# Patient Record
Sex: Female | Born: 1935 | Race: White | Hispanic: No
Health system: Southern US, Community
[De-identification: ages and names within clinical notes are randomized; demographics above are authoritative.]

## PROBLEM LIST (undated history)

## (undated) DIAGNOSIS — E7800 Pure hypercholesterolemia, unspecified: Secondary | ICD-10-CM

## (undated) DIAGNOSIS — T39395A Adverse effect of other nonsteroidal anti-inflammatory drugs [NSAID], initial encounter: Secondary | ICD-10-CM

## (undated) DIAGNOSIS — I4891 Unspecified atrial fibrillation: Secondary | ICD-10-CM

## (undated) DIAGNOSIS — I10 Essential (primary) hypertension: Secondary | ICD-10-CM

## (undated) DIAGNOSIS — E119 Type 2 diabetes mellitus without complications: Secondary | ICD-10-CM

## (undated) DIAGNOSIS — K222 Esophageal obstruction: Secondary | ICD-10-CM

## (undated) DIAGNOSIS — K648 Other hemorrhoids: Secondary | ICD-10-CM

## (undated) DIAGNOSIS — K449 Diaphragmatic hernia without obstruction or gangrene: Secondary | ICD-10-CM

## (undated) DIAGNOSIS — E039 Hypothyroidism, unspecified: Secondary | ICD-10-CM

## (undated) DIAGNOSIS — K269 Duodenal ulcer, unspecified as acute or chronic, without hemorrhage or perforation: Secondary | ICD-10-CM

## (undated) HISTORY — DX: Other hemorrhoids: K64.8

## (undated) HISTORY — DX: Type 2 diabetes mellitus without complications: E11.9

## (undated) HISTORY — DX: Esophageal obstruction: K22.2

## (undated) HISTORY — DX: Adverse effect of other nonsteroidal anti-inflammatory drugs (NSAID), initial encounter: T39.395A

## (undated) HISTORY — DX: Hypothyroidism, unspecified: E03.9

## (undated) HISTORY — DX: Pure hypercholesterolemia, unspecified: E78.00

## (undated) HISTORY — DX: Duodenal ulcer, unspecified as acute or chronic, without hemorrhage or perforation: K26.9

## (undated) HISTORY — DX: Unspecified atrial fibrillation: I48.91

## (undated) HISTORY — DX: Essential (primary) hypertension: I10

## (undated) HISTORY — DX: Diaphragmatic hernia without obstruction or gangrene: K44.9

---

## 1997-06-17 ENCOUNTER — Other Ambulatory Visit: Admission: RE | Admit: 1997-06-17 | Discharge: 1997-06-17 | Payer: Self-pay | Admitting: Internal Medicine

## 1999-08-13 ENCOUNTER — Ambulatory Visit (HOSPITAL_COMMUNITY): Admission: RE | Admit: 1999-08-13 | Discharge: 1999-08-13 | Payer: Self-pay | Admitting: Internal Medicine

## 1999-08-13 ENCOUNTER — Encounter: Payer: Self-pay | Admitting: Internal Medicine

## 1999-08-18 ENCOUNTER — Ambulatory Visit (HOSPITAL_COMMUNITY): Admission: RE | Admit: 1999-08-18 | Discharge: 1999-08-18 | Payer: Self-pay | Admitting: Internal Medicine

## 1999-09-20 ENCOUNTER — Ambulatory Visit (HOSPITAL_COMMUNITY): Admission: RE | Admit: 1999-09-20 | Discharge: 1999-09-20 | Payer: Self-pay | Admitting: Cardiology

## 2000-11-09 ENCOUNTER — Ambulatory Visit (HOSPITAL_COMMUNITY): Admission: RE | Admit: 2000-11-09 | Discharge: 2000-11-09 | Payer: Self-pay | Admitting: *Deleted

## 2000-12-13 ENCOUNTER — Ambulatory Visit (HOSPITAL_COMMUNITY): Admission: RE | Admit: 2000-12-13 | Discharge: 2000-12-13 | Payer: Self-pay | Admitting: *Deleted

## 2001-04-02 ENCOUNTER — Encounter: Payer: Self-pay | Admitting: Cardiology

## 2001-04-02 ENCOUNTER — Ambulatory Visit (HOSPITAL_COMMUNITY): Admission: RE | Admit: 2001-04-02 | Discharge: 2001-04-02 | Payer: Self-pay | Admitting: Cardiology

## 2002-09-22 ENCOUNTER — Inpatient Hospital Stay (HOSPITAL_COMMUNITY): Admission: EM | Admit: 2002-09-22 | Discharge: 2002-09-25 | Payer: Self-pay | Admitting: Emergency Medicine

## 2002-09-22 ENCOUNTER — Encounter: Payer: Self-pay | Admitting: Emergency Medicine

## 2002-09-26 ENCOUNTER — Encounter: Payer: Self-pay | Admitting: Emergency Medicine

## 2002-09-26 ENCOUNTER — Observation Stay (HOSPITAL_COMMUNITY): Admission: EM | Admit: 2002-09-26 | Discharge: 2002-09-27 | Payer: Self-pay | Admitting: Emergency Medicine

## 2002-10-01 ENCOUNTER — Observation Stay (HOSPITAL_COMMUNITY): Admission: EM | Admit: 2002-10-01 | Discharge: 2002-10-02 | Payer: Self-pay | Admitting: Emergency Medicine

## 2002-10-01 ENCOUNTER — Encounter: Payer: Self-pay | Admitting: Emergency Medicine

## 2002-10-21 ENCOUNTER — Encounter (HOSPITAL_COMMUNITY): Admission: RE | Admit: 2002-10-21 | Discharge: 2003-01-19 | Payer: Self-pay | Admitting: Emergency Medicine

## 2002-12-25 ENCOUNTER — Inpatient Hospital Stay (HOSPITAL_COMMUNITY): Admission: EM | Admit: 2002-12-25 | Discharge: 2002-12-27 | Payer: Self-pay | Admitting: Emergency Medicine

## 2002-12-25 ENCOUNTER — Encounter: Payer: Self-pay | Admitting: Surgery

## 2002-12-26 ENCOUNTER — Encounter (INDEPENDENT_AMBULATORY_CARE_PROVIDER_SITE_OTHER): Payer: Self-pay | Admitting: Specialist

## 2003-01-07 ENCOUNTER — Inpatient Hospital Stay (HOSPITAL_COMMUNITY): Admission: EM | Admit: 2003-01-07 | Discharge: 2003-01-12 | Payer: Self-pay | Admitting: Emergency Medicine

## 2003-03-02 ENCOUNTER — Encounter: Admission: RE | Admit: 2003-03-02 | Discharge: 2003-03-02 | Payer: Self-pay | Admitting: Internal Medicine

## 2003-05-24 ENCOUNTER — Emergency Department (HOSPITAL_COMMUNITY): Admission: EM | Admit: 2003-05-24 | Discharge: 2003-05-24 | Payer: Self-pay

## 2003-10-17 ENCOUNTER — Inpatient Hospital Stay (HOSPITAL_COMMUNITY): Admission: EM | Admit: 2003-10-17 | Discharge: 2003-10-21 | Payer: Self-pay | Admitting: Emergency Medicine

## 2004-02-11 ENCOUNTER — Ambulatory Visit (HOSPITAL_COMMUNITY): Admission: RE | Admit: 2004-02-11 | Discharge: 2004-02-11 | Payer: Self-pay | Admitting: Cardiology

## 2004-06-22 ENCOUNTER — Encounter (INDEPENDENT_AMBULATORY_CARE_PROVIDER_SITE_OTHER): Payer: Self-pay | Admitting: Radiology

## 2004-06-22 ENCOUNTER — Encounter (INDEPENDENT_AMBULATORY_CARE_PROVIDER_SITE_OTHER): Payer: Self-pay | Admitting: Specialist

## 2004-06-22 ENCOUNTER — Encounter: Admission: RE | Admit: 2004-06-22 | Discharge: 2004-06-22 | Payer: Self-pay | Admitting: Surgery

## 2004-07-01 ENCOUNTER — Encounter: Admission: RE | Admit: 2004-07-01 | Discharge: 2004-07-01 | Payer: Self-pay | Admitting: Surgery

## 2004-07-05 ENCOUNTER — Encounter: Admission: RE | Admit: 2004-07-05 | Discharge: 2004-07-05 | Payer: Self-pay | Admitting: Surgery

## 2004-07-07 ENCOUNTER — Ambulatory Visit (HOSPITAL_COMMUNITY): Admission: RE | Admit: 2004-07-07 | Discharge: 2004-07-07 | Payer: Self-pay | Admitting: Surgery

## 2004-07-07 ENCOUNTER — Ambulatory Visit (HOSPITAL_BASED_OUTPATIENT_CLINIC_OR_DEPARTMENT_OTHER): Admission: RE | Admit: 2004-07-07 | Discharge: 2004-07-07 | Payer: Self-pay | Admitting: Surgery

## 2004-07-07 ENCOUNTER — Encounter (INDEPENDENT_AMBULATORY_CARE_PROVIDER_SITE_OTHER): Payer: Self-pay | Admitting: Specialist

## 2004-07-07 ENCOUNTER — Encounter (INDEPENDENT_AMBULATORY_CARE_PROVIDER_SITE_OTHER): Payer: Self-pay | Admitting: Surgery

## 2004-07-08 ENCOUNTER — Ambulatory Visit: Payer: Self-pay | Admitting: Oncology

## 2004-07-13 ENCOUNTER — Ambulatory Visit: Admission: RE | Admit: 2004-07-13 | Discharge: 2004-09-20 | Payer: Self-pay | Admitting: Radiation Oncology

## 2004-09-09 ENCOUNTER — Ambulatory Visit: Payer: Self-pay | Admitting: Oncology

## 2004-09-15 ENCOUNTER — Ambulatory Visit (HOSPITAL_BASED_OUTPATIENT_CLINIC_OR_DEPARTMENT_OTHER): Admission: RE | Admit: 2004-09-15 | Discharge: 2004-09-15 | Payer: Self-pay | Admitting: General Surgery

## 2004-09-15 ENCOUNTER — Ambulatory Visit: Admission: RE | Admit: 2004-09-15 | Discharge: 2004-09-15 | Payer: Self-pay | Admitting: General Surgery

## 2004-10-26 ENCOUNTER — Ambulatory Visit: Payer: Self-pay | Admitting: Oncology

## 2004-12-14 ENCOUNTER — Ambulatory Visit: Admission: RE | Admit: 2004-12-14 | Discharge: 2005-03-10 | Payer: Self-pay | Admitting: Radiation Oncology

## 2004-12-16 ENCOUNTER — Ambulatory Visit: Payer: Self-pay | Admitting: Oncology

## 2004-12-23 ENCOUNTER — Encounter: Admission: RE | Admit: 2004-12-23 | Discharge: 2004-12-23 | Payer: Self-pay | Admitting: Radiation Oncology

## 2005-02-25 ENCOUNTER — Ambulatory Visit: Payer: Self-pay | Admitting: Oncology

## 2005-04-19 ENCOUNTER — Ambulatory Visit: Payer: Self-pay | Admitting: Oncology

## 2005-05-23 ENCOUNTER — Encounter: Admission: RE | Admit: 2005-05-23 | Discharge: 2005-05-23 | Payer: Self-pay | Admitting: Surgery

## 2005-06-23 ENCOUNTER — Ambulatory Visit: Payer: Self-pay | Admitting: Oncology

## 2005-06-24 ENCOUNTER — Ambulatory Visit (HOSPITAL_COMMUNITY): Admission: RE | Admit: 2005-06-24 | Discharge: 2005-06-24 | Payer: Self-pay | Admitting: Oncology

## 2005-06-24 LAB — LACTATE DEHYDROGENASE: LDH: 198 U/L (ref 94–250)

## 2005-06-24 LAB — CBC WITH DIFFERENTIAL/PLATELET
Basophils Absolute: 0.1 10*3/uL (ref 0.0–0.1)
Eosinophils Absolute: 0.1 10*3/uL (ref 0.0–0.5)
HCT: 39.6 % (ref 34.8–46.6)
HGB: 13.8 g/dL (ref 11.6–15.9)
MONO#: 0.6 10*3/uL (ref 0.1–0.9)
NEUT#: 2.9 10*3/uL (ref 1.5–6.5)
RDW: 14.4 % (ref 11.3–14.5)
lymph#: 1.5 10*3/uL (ref 0.9–3.3)

## 2005-06-24 LAB — COMPREHENSIVE METABOLIC PANEL
AST: 21 U/L (ref 0–37)
Albumin: 3.9 g/dL (ref 3.5–5.2)
BUN: 20 mg/dL (ref 6–23)
CO2: 24 mEq/L (ref 19–32)
Calcium: 9 mg/dL (ref 8.4–10.5)
Chloride: 105 mEq/L (ref 96–112)
Glucose, Bld: 148 mg/dL — ABNORMAL HIGH (ref 70–99)
Potassium: 4.2 mEq/L (ref 3.5–5.3)

## 2005-08-17 ENCOUNTER — Ambulatory Visit: Payer: Self-pay | Admitting: Oncology

## 2005-08-19 LAB — CBC WITH DIFFERENTIAL/PLATELET
Basophils Absolute: 0 10*3/uL (ref 0.0–0.1)
Eosinophils Absolute: 0.1 10*3/uL (ref 0.0–0.5)
HGB: 14.4 g/dL (ref 11.6–15.9)
MONO#: 0.6 10*3/uL (ref 0.1–0.9)
NEUT#: 3.4 10*3/uL (ref 1.5–6.5)
RBC: 4.68 10*6/uL (ref 3.70–5.32)
RDW: 13.4 % (ref 11.3–14.5)
WBC: 5.7 10*3/uL (ref 3.9–10.0)

## 2005-08-19 LAB — COMPREHENSIVE METABOLIC PANEL
BUN: 21 mg/dL (ref 6–23)
CO2: 24 mEq/L (ref 19–32)
Calcium: 8.9 mg/dL (ref 8.4–10.5)
Chloride: 105 mEq/L (ref 96–112)
Creatinine, Ser: 1.01 mg/dL (ref 0.40–1.20)
Total Bilirubin: 1.1 mg/dL (ref 0.3–1.2)

## 2005-08-19 LAB — LACTATE DEHYDROGENASE: LDH: 182 U/L (ref 94–250)

## 2005-10-12 ENCOUNTER — Ambulatory Visit: Payer: Self-pay | Admitting: Oncology

## 2005-10-14 LAB — COMPREHENSIVE METABOLIC PANEL
Albumin: 3.9 g/dL (ref 3.5–5.2)
Alkaline Phosphatase: 64 U/L (ref 39–117)
BUN: 19 mg/dL (ref 6–23)
Calcium: 9.2 mg/dL (ref 8.4–10.5)
Chloride: 104 mEq/L (ref 96–112)
Creatinine, Ser: 0.92 mg/dL (ref 0.40–1.20)
Glucose, Bld: 184 mg/dL — ABNORMAL HIGH (ref 70–99)
Potassium: 4.2 mEq/L (ref 3.5–5.3)

## 2005-10-14 LAB — CBC WITH DIFFERENTIAL/PLATELET
Basophils Absolute: 0 10*3/uL (ref 0.0–0.1)
EOS%: 2.7 % (ref 0.0–7.0)
Eosinophils Absolute: 0.2 10*3/uL (ref 0.0–0.5)
HCT: 41.1 % (ref 34.8–46.6)
HGB: 14.2 g/dL (ref 11.6–15.9)
MCH: 31 pg (ref 26.0–34.0)
MCV: 89.8 fL (ref 81.0–101.0)
MONO%: 12.2 % (ref 0.0–13.0)
NEUT#: 3.3 10*3/uL (ref 1.5–6.5)
NEUT%: 57.1 % (ref 39.6–76.8)
RDW: 13.4 % (ref 11.3–14.5)

## 2005-10-24 ENCOUNTER — Ambulatory Visit (HOSPITAL_COMMUNITY): Admission: RE | Admit: 2005-10-24 | Discharge: 2005-10-24 | Payer: Self-pay | Admitting: Oncology

## 2005-11-10 ENCOUNTER — Emergency Department (HOSPITAL_COMMUNITY): Admission: EM | Admit: 2005-11-10 | Discharge: 2005-11-10 | Payer: Self-pay | Admitting: Emergency Medicine

## 2005-11-24 ENCOUNTER — Encounter: Admission: RE | Admit: 2005-11-24 | Discharge: 2005-11-24 | Payer: Self-pay | Admitting: Orthopaedic Surgery

## 2005-12-15 ENCOUNTER — Encounter: Admission: RE | Admit: 2005-12-15 | Discharge: 2005-12-23 | Payer: Self-pay | Admitting: Orthopaedic Surgery

## 2005-12-20 ENCOUNTER — Ambulatory Visit: Payer: Self-pay | Admitting: Oncology

## 2005-12-22 LAB — CBC WITH DIFFERENTIAL/PLATELET
BASO%: 0.5 % (ref 0.0–2.0)
Basophils Absolute: 0 10*3/uL (ref 0.0–0.1)
EOS%: 1.6 % (ref 0.0–7.0)
Eosinophils Absolute: 0.1 10*3/uL (ref 0.0–0.5)
HCT: 42.2 % (ref 34.8–46.6)
HGB: 14.6 g/dL (ref 11.6–15.9)
MONO#: 0.7 10*3/uL (ref 0.1–0.9)
MONO%: 10.8 % (ref 0.0–13.0)
NEUT#: 3.7 10*3/uL (ref 1.5–6.5)
NEUT%: 58 % (ref 39.6–76.8)
Platelets: 259 10*3/uL (ref 145–400)
WBC: 6.4 10*3/uL (ref 3.9–10.0)

## 2005-12-22 LAB — COMPREHENSIVE METABOLIC PANEL
ALT: 18 U/L (ref 0–40)
AST: 23 U/L (ref 0–37)
Alkaline Phosphatase: 71 U/L (ref 39–117)
BUN: 20 mg/dL (ref 6–23)
Calcium: 9.2 mg/dL (ref 8.4–10.5)
Creatinine, Ser: 0.89 mg/dL (ref 0.40–1.20)
Total Bilirubin: 1.3 mg/dL — ABNORMAL HIGH (ref 0.3–1.2)

## 2006-01-03 ENCOUNTER — Encounter: Admission: RE | Admit: 2006-01-03 | Discharge: 2006-01-03 | Payer: Self-pay | Admitting: Orthopaedic Surgery

## 2006-02-13 ENCOUNTER — Ambulatory Visit: Payer: Self-pay | Admitting: Oncology

## 2006-02-16 LAB — COMPREHENSIVE METABOLIC PANEL
AST: 17 U/L (ref 0–37)
Alkaline Phosphatase: 66 U/L (ref 39–117)
BUN: 19 mg/dL (ref 6–23)
Glucose, Bld: 156 mg/dL — ABNORMAL HIGH (ref 70–99)
Sodium: 141 mEq/L (ref 135–145)
Total Bilirubin: 1.3 mg/dL — ABNORMAL HIGH (ref 0.3–1.2)

## 2006-02-16 LAB — CBC WITH DIFFERENTIAL/PLATELET
Basophils Absolute: 0 10*3/uL (ref 0.0–0.1)
EOS%: 2 % (ref 0.0–7.0)
Eosinophils Absolute: 0.1 10*3/uL (ref 0.0–0.5)
LYMPH%: 26.4 % (ref 14.0–48.0)
MCH: 31.4 pg (ref 26.0–34.0)
MCV: 90.9 fL (ref 81.0–101.0)
MONO%: 12.3 % (ref 0.0–13.0)
NEUT#: 4.2 10*3/uL (ref 1.5–6.5)
Platelets: 237 10*3/uL (ref 145–400)
RBC: 4.49 10*6/uL (ref 3.70–5.32)

## 2006-04-17 ENCOUNTER — Ambulatory Visit: Payer: Self-pay | Admitting: Oncology

## 2006-04-20 LAB — CBC WITH DIFFERENTIAL/PLATELET
Basophils Absolute: 0 10*3/uL (ref 0.0–0.1)
Eosinophils Absolute: 0.2 10*3/uL (ref 0.0–0.5)
HCT: 41.8 % (ref 34.8–46.6)
HGB: 14.8 g/dL (ref 11.6–15.9)
LYMPH%: 31.8 % (ref 14.0–48.0)
MCV: 89.4 fL (ref 81.0–101.0)
MONO#: 0.8 10*3/uL (ref 0.1–0.9)
MONO%: 10.7 % (ref 0.0–13.0)
NEUT#: 4 10*3/uL (ref 1.5–6.5)
NEUT%: 55.1 % (ref 39.6–76.8)
Platelets: 246 10*3/uL (ref 145–400)
RBC: 4.67 10*6/uL (ref 3.70–5.32)

## 2006-04-20 LAB — COMPREHENSIVE METABOLIC PANEL
Alkaline Phosphatase: 61 U/L (ref 39–117)
BUN: 19 mg/dL (ref 6–23)
CO2: 24 mEq/L (ref 19–32)
Glucose, Bld: 169 mg/dL — ABNORMAL HIGH (ref 70–99)
Total Bilirubin: 1.3 mg/dL — ABNORMAL HIGH (ref 0.3–1.2)

## 2006-04-20 LAB — LACTATE DEHYDROGENASE: LDH: 191 U/L (ref 94–250)

## 2006-05-10 ENCOUNTER — Encounter: Admission: RE | Admit: 2006-05-10 | Discharge: 2006-05-10 | Payer: Self-pay | Admitting: Orthopaedic Surgery

## 2006-05-22 ENCOUNTER — Encounter: Admission: RE | Admit: 2006-05-22 | Discharge: 2006-05-22 | Payer: Self-pay | Admitting: Orthopaedic Surgery

## 2006-05-25 ENCOUNTER — Encounter: Admission: RE | Admit: 2006-05-25 | Discharge: 2006-05-25 | Payer: Self-pay | Admitting: Oncology

## 2006-06-13 ENCOUNTER — Ambulatory Visit: Payer: Self-pay | Admitting: Oncology

## 2006-06-15 ENCOUNTER — Ambulatory Visit (HOSPITAL_COMMUNITY): Admission: RE | Admit: 2006-06-15 | Discharge: 2006-06-15 | Payer: Self-pay | Admitting: Oncology

## 2006-06-15 LAB — CBC WITH DIFFERENTIAL/PLATELET
Basophils Absolute: 0 10*3/uL (ref 0.0–0.1)
EOS%: 1.5 % (ref 0.0–7.0)
HCT: 40 % (ref 34.8–46.6)
MONO%: 10.2 % (ref 0.0–13.0)
NEUT#: 3.4 10*3/uL (ref 1.5–6.5)
Platelets: 193 10*3/uL (ref 145–400)
RBC: 4.5 10*6/uL (ref 3.70–5.32)
WBC: 5.7 10*3/uL (ref 3.9–10.0)

## 2006-06-15 LAB — COMPREHENSIVE METABOLIC PANEL
AST: 17 U/L (ref 0–37)
Albumin: 3.7 g/dL (ref 3.5–5.2)
Alkaline Phosphatase: 56 U/L (ref 39–117)
BUN: 20 mg/dL (ref 6–23)
Creatinine, Ser: 0.82 mg/dL (ref 0.40–1.20)
Potassium: 4.1 mEq/L (ref 3.5–5.3)

## 2006-06-29 ENCOUNTER — Ambulatory Visit (HOSPITAL_BASED_OUTPATIENT_CLINIC_OR_DEPARTMENT_OTHER): Admission: RE | Admit: 2006-06-29 | Discharge: 2006-06-29 | Payer: Self-pay | Admitting: Surgery

## 2006-08-14 ENCOUNTER — Inpatient Hospital Stay (HOSPITAL_COMMUNITY): Admission: EM | Admit: 2006-08-14 | Discharge: 2006-08-23 | Payer: Self-pay | Admitting: Emergency Medicine

## 2006-08-14 ENCOUNTER — Ambulatory Visit: Payer: Self-pay | Admitting: Vascular Surgery

## 2006-08-15 ENCOUNTER — Encounter (INDEPENDENT_AMBULATORY_CARE_PROVIDER_SITE_OTHER): Payer: Self-pay | Admitting: Interventional Cardiology

## 2006-08-16 ENCOUNTER — Encounter (INDEPENDENT_AMBULATORY_CARE_PROVIDER_SITE_OTHER): Payer: Self-pay | Admitting: *Deleted

## 2006-08-17 ENCOUNTER — Encounter (INDEPENDENT_AMBULATORY_CARE_PROVIDER_SITE_OTHER): Payer: Self-pay | Admitting: Cardiology

## 2006-08-18 ENCOUNTER — Encounter (INDEPENDENT_AMBULATORY_CARE_PROVIDER_SITE_OTHER): Payer: Self-pay | Admitting: Cardiovascular Disease

## 2006-09-22 ENCOUNTER — Inpatient Hospital Stay (HOSPITAL_COMMUNITY): Admission: EM | Admit: 2006-09-22 | Discharge: 2006-09-29 | Payer: Self-pay | Admitting: Emergency Medicine

## 2006-10-10 ENCOUNTER — Ambulatory Visit: Payer: Self-pay | Admitting: Oncology

## 2007-02-01 ENCOUNTER — Ambulatory Visit: Payer: Self-pay | Admitting: Oncology

## 2007-02-05 LAB — COMPREHENSIVE METABOLIC PANEL
ALT: 15 U/L (ref 0–35)
AST: 19 U/L (ref 0–37)
Alkaline Phosphatase: 54 U/L (ref 39–117)
Creatinine, Ser: 0.94 mg/dL (ref 0.40–1.20)
Total Bilirubin: 1.3 mg/dL — ABNORMAL HIGH (ref 0.3–1.2)

## 2007-02-05 LAB — CBC WITH DIFFERENTIAL/PLATELET
BASO%: 0.4 % (ref 0.0–2.0)
EOS%: 0.9 % (ref 0.0–7.0)
HCT: 38.4 % (ref 34.8–46.6)
LYMPH%: 25.9 % (ref 14.0–48.0)
MCH: 30.3 pg (ref 26.0–34.0)
MCHC: 34.5 g/dL (ref 32.0–36.0)
NEUT%: 62.6 % (ref 39.6–76.8)
Platelets: 241 10*3/uL (ref 145–400)
lymph#: 1.9 10*3/uL (ref 0.9–3.3)

## 2007-02-21 ENCOUNTER — Emergency Department (HOSPITAL_COMMUNITY): Admission: EM | Admit: 2007-02-21 | Discharge: 2007-02-21 | Payer: Self-pay | Admitting: Emergency Medicine

## 2007-03-01 ENCOUNTER — Encounter (INDEPENDENT_AMBULATORY_CARE_PROVIDER_SITE_OTHER): Payer: Self-pay | Admitting: *Deleted

## 2007-03-01 ENCOUNTER — Ambulatory Visit (HOSPITAL_COMMUNITY): Admission: RE | Admit: 2007-03-01 | Discharge: 2007-03-01 | Payer: Self-pay | Admitting: *Deleted

## 2007-06-01 ENCOUNTER — Ambulatory Visit: Payer: Self-pay | Admitting: Oncology

## 2007-06-18 ENCOUNTER — Encounter: Admission: RE | Admit: 2007-06-18 | Discharge: 2007-06-18 | Payer: Self-pay | Admitting: Surgery

## 2007-10-02 ENCOUNTER — Ambulatory Visit: Payer: Self-pay | Admitting: Oncology

## 2007-10-05 LAB — CBC WITH DIFFERENTIAL/PLATELET
BASO%: 0.5 % (ref 0.0–2.0)
Basophils Absolute: 0 10*3/uL (ref 0.0–0.1)
EOS%: 1.2 % (ref 0.0–7.0)
HGB: 14.3 g/dL (ref 11.6–15.9)
MCH: 30 pg (ref 26.0–34.0)
MCHC: 34.3 g/dL (ref 32.0–36.0)
MCV: 87.4 fL (ref 81.0–101.0)
MONO%: 11 % (ref 0.0–13.0)
RBC: 4.76 10*6/uL (ref 3.70–5.32)
RDW: 14.2 % (ref 11.3–14.5)
lymph#: 2.1 10*3/uL (ref 0.9–3.3)

## 2007-10-05 LAB — COMPREHENSIVE METABOLIC PANEL
ALT: 20 U/L (ref 0–35)
AST: 20 U/L (ref 0–37)
Albumin: 3.8 g/dL (ref 3.5–5.2)
Alkaline Phosphatase: 67 U/L (ref 39–117)
BUN: 22 mg/dL (ref 6–23)
Calcium: 9.3 mg/dL (ref 8.4–10.5)
Chloride: 105 mEq/L (ref 96–112)
Potassium: 4.3 mEq/L (ref 3.5–5.3)
Sodium: 140 mEq/L (ref 135–145)

## 2007-10-14 ENCOUNTER — Inpatient Hospital Stay (HOSPITAL_COMMUNITY): Admission: EM | Admit: 2007-10-14 | Discharge: 2007-10-17 | Payer: Self-pay | Admitting: Emergency Medicine

## 2008-02-01 ENCOUNTER — Ambulatory Visit: Payer: Self-pay | Admitting: Oncology

## 2008-02-05 LAB — LACTATE DEHYDROGENASE: LDH: 234 U/L (ref 94–250)

## 2008-02-05 LAB — CBC WITH DIFFERENTIAL/PLATELET
BASO%: 0.2 % (ref 0.0–2.0)
EOS%: 1.9 % (ref 0.0–7.0)
HCT: 45.3 % (ref 34.8–46.6)
HGB: 15.5 g/dL (ref 11.6–15.9)
MCHC: 34.1 g/dL (ref 32.0–36.0)
MONO#: 0.9 10*3/uL (ref 0.1–0.9)
NEUT%: 61.1 % (ref 39.6–76.8)
RDW: 13.9 % (ref 11.3–14.5)
WBC: 9.3 10*3/uL (ref 3.9–10.0)
lymph#: 2.5 10*3/uL (ref 0.9–3.3)

## 2008-02-05 LAB — COMPREHENSIVE METABOLIC PANEL
ALT: 19 U/L (ref 0–35)
AST: 24 U/L (ref 0–37)
Albumin: 4 g/dL (ref 3.5–5.2)
CO2: 22 mEq/L (ref 19–32)
Calcium: 9.5 mg/dL (ref 8.4–10.5)
Chloride: 102 mEq/L (ref 96–112)
Creatinine, Ser: 1.03 mg/dL (ref 0.40–1.20)
Potassium: 3.9 mEq/L (ref 3.5–5.3)
Total Protein: 6.6 g/dL (ref 6.0–8.3)

## 2008-02-19 ENCOUNTER — Emergency Department (HOSPITAL_COMMUNITY): Admission: EM | Admit: 2008-02-19 | Discharge: 2008-02-19 | Payer: Self-pay | Admitting: Emergency Medicine

## 2008-06-03 ENCOUNTER — Ambulatory Visit: Payer: Self-pay | Admitting: Oncology

## 2008-06-05 LAB — CBC WITH DIFFERENTIAL/PLATELET
Basophils Absolute: 0 10*3/uL (ref 0.0–0.1)
Eosinophils Absolute: 0.1 10*3/uL (ref 0.0–0.5)
HCT: 45.7 % (ref 34.8–46.6)
HGB: 15.6 g/dL (ref 11.6–15.9)
LYMPH%: 34.6 % (ref 14.0–49.7)
MONO#: 0.7 10*3/uL (ref 0.1–0.9)
NEUT#: 4.2 10*3/uL (ref 1.5–6.5)
NEUT%: 55.1 % (ref 38.4–76.8)
Platelets: 216 10*3/uL (ref 145–400)
WBC: 7.7 10*3/uL (ref 3.9–10.3)

## 2008-06-05 LAB — COMPREHENSIVE METABOLIC PANEL
CO2: 22 mEq/L (ref 19–32)
Calcium: 8.9 mg/dL (ref 8.4–10.5)
Chloride: 109 mEq/L (ref 96–112)
Creatinine, Ser: 1.28 mg/dL — ABNORMAL HIGH (ref 0.40–1.20)
Glucose, Bld: 164 mg/dL — ABNORMAL HIGH (ref 70–99)
Total Bilirubin: 1.3 mg/dL — ABNORMAL HIGH (ref 0.3–1.2)

## 2008-06-05 LAB — LACTATE DEHYDROGENASE: LDH: 228 U/L (ref 94–250)

## 2008-06-18 ENCOUNTER — Encounter: Admission: RE | Admit: 2008-06-18 | Discharge: 2008-06-18 | Payer: Self-pay | Admitting: Oncology

## 2008-10-02 ENCOUNTER — Ambulatory Visit: Payer: Self-pay | Admitting: Oncology

## 2008-11-23 IMAGING — CR DG CHEST 1V PORT
1 series · 1 of 1 positions shown · non-contrast
Comparison: 08/16/2006

CLINICAL DATA: Chest pain and shortness of breath. Coronary artery disease. Two recent coronary stents. 
 PORTABLE CHEST:

[view not recorded]
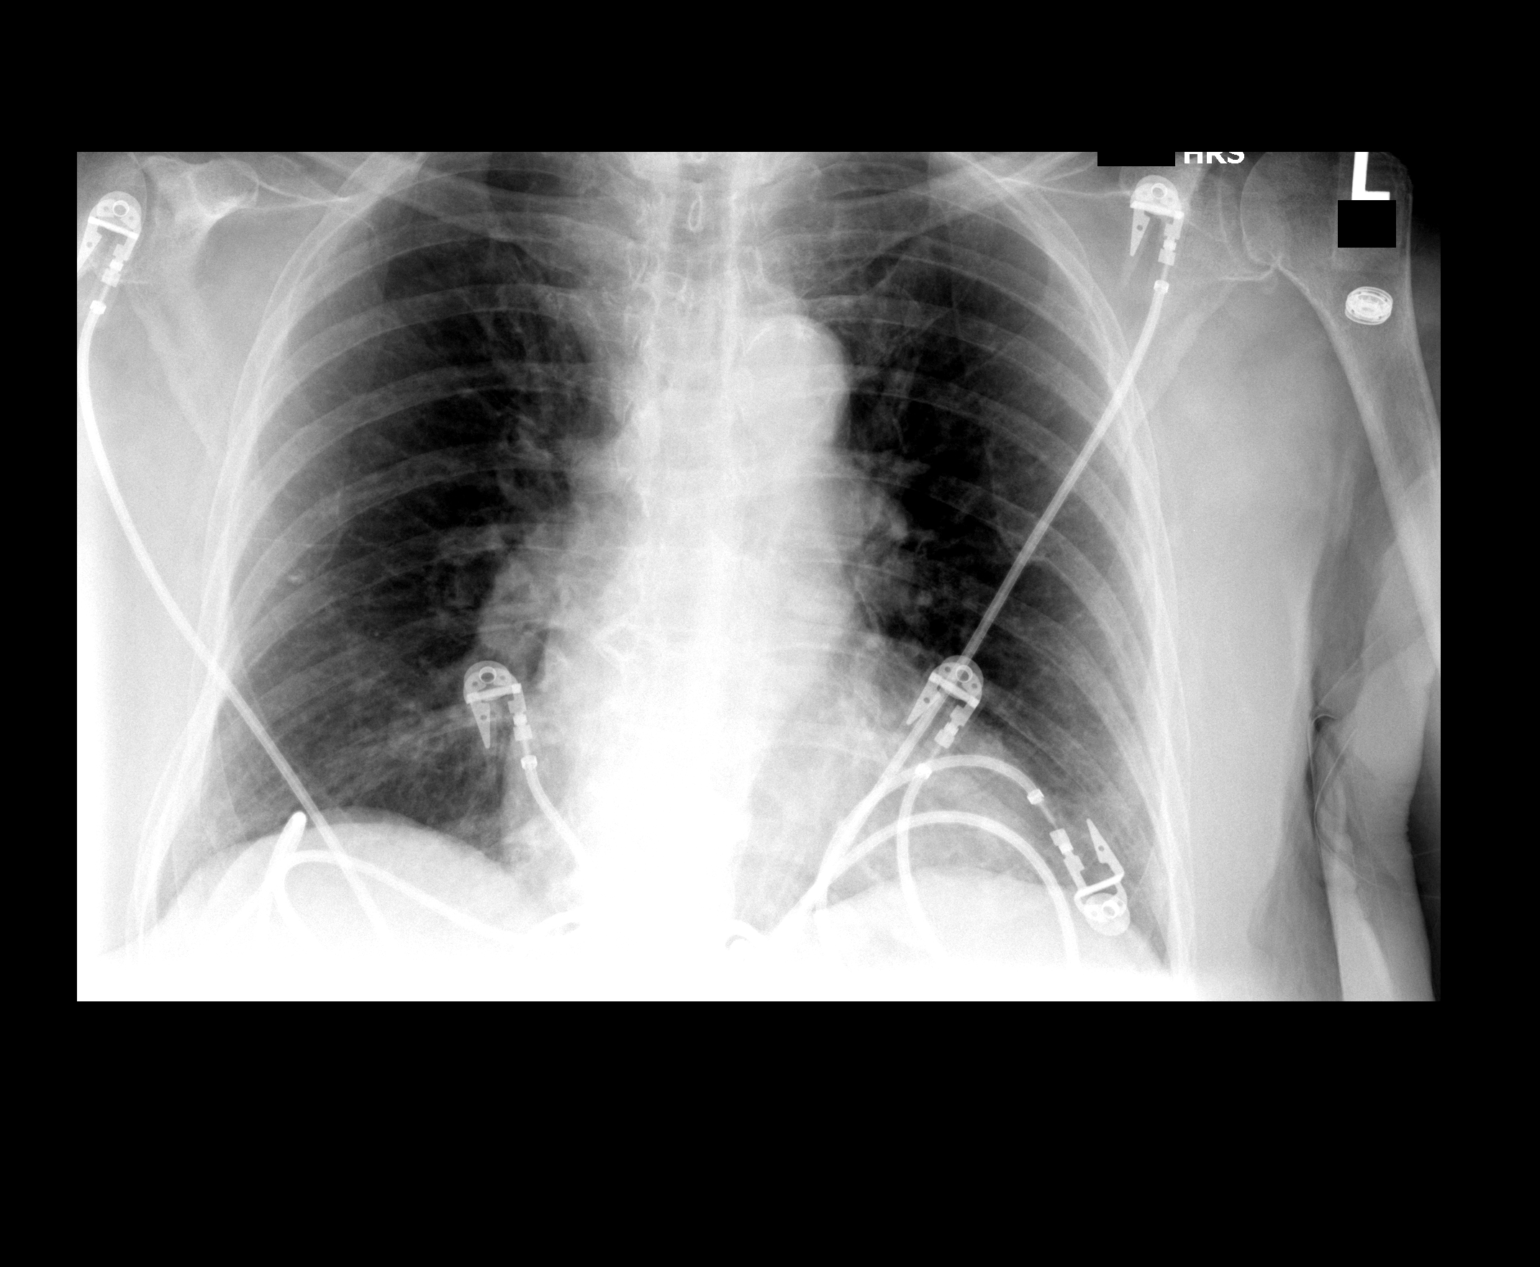

[1 of 1 positions shown; findings below may reference images not displayed]

FINDINGS: Heart size and vascularity are normal. There is some tortuosity and calcification of the thoracic aorta. Lungs are clear. No effusions. No acute bony abnormality.
IMPRESSION: No acute abnormality.

## 2009-07-29 ENCOUNTER — Encounter: Admission: RE | Admit: 2009-07-29 | Discharge: 2009-07-29 | Payer: Self-pay | Admitting: Oncology

## 2010-03-30 ENCOUNTER — Inpatient Hospital Stay (HOSPITAL_COMMUNITY)
Admission: EM | Admit: 2010-03-30 | Discharge: 2010-04-02 | Payer: Self-pay | Source: Home / Self Care | Attending: Internal Medicine | Admitting: Internal Medicine

## 2010-03-31 LAB — URINE MICROSCOPIC-ADD ON

## 2010-03-31 LAB — COMPREHENSIVE METABOLIC PANEL
ALT: 19 U/L (ref 0–35)
AST: 22 U/L (ref 0–37)
Albumin: 3 g/dL — ABNORMAL LOW (ref 3.5–5.2)
Alkaline Phosphatase: 72 U/L (ref 39–117)
BUN: 26 mg/dL — ABNORMAL HIGH (ref 6–23)
CO2: 23 mEq/L (ref 19–32)
Calcium: 9.1 mg/dL (ref 8.4–10.5)
Chloride: 104 mEq/L (ref 96–112)
Creatinine, Ser: 1.14 mg/dL (ref 0.4–1.2)
GFR calc Af Amer: 55 mL/min — ABNORMAL LOW (ref >60)
GFR calc non Af Amer: 45 mL/min — ABNORMAL LOW (ref >60)
Glucose, Bld: 169 mg/dL — ABNORMAL HIGH (ref 70–99)
Potassium: 4 mEq/L (ref 3.5–5.1)
Sodium: 138 mEq/L (ref 135–145)
Total Bilirubin: 1.7 mg/dL — ABNORMAL HIGH (ref 0.3–1.2)
Total Protein: 6.4 g/dL (ref 6.0–8.3)

## 2010-03-31 LAB — CBC
HCT: 44.4 % (ref 36.0–46.0)
HCT: 40 % (ref 36.0–46.0)
Hemoglobin: 15 g/dL (ref 12.0–15.0)
Hemoglobin: 14.1 g/dL (ref 12.0–15.0)
MCH: 30.4 pg (ref 26.0–34.0)
MCH: 30.9 pg (ref 26.0–34.0)
MCHC: 33.8 g/dL (ref 30.0–36.0)
MCHC: 35.3 g/dL (ref 30.0–36.0)
MCV: 89.9 fL (ref 78.0–100.0)
MCV: 87.5 fL (ref 78.0–100.0)
Platelets: 237 10*3/uL (ref 150–400)
Platelets: 246 10*3/uL (ref 150–400)
RBC: 4.94 MIL/uL (ref 3.87–5.11)
RBC: 4.57 MIL/uL (ref 3.87–5.11)
RDW: 13.5 % (ref 11.5–15.5)
RDW: 13.3 % (ref 11.5–15.5)
WBC: 12 10*3/uL — ABNORMAL HIGH (ref 4.0–10.5)
WBC: 11.5 10*3/uL — ABNORMAL HIGH (ref 4.0–10.5)

## 2010-03-31 LAB — CK TOTAL AND CKMB (NOT AT ARMC)
CK, MB: 2.1 ng/mL (ref 0.3–4.0)
Relative Index: INVALID (ref 0.0–2.5)
Total CK: 94 U/L (ref 7–177)

## 2010-03-31 LAB — DIFFERENTIAL
Basophils Absolute: 0.1 10*3/uL (ref 0.0–0.1)
Basophils Relative: 1 % (ref 0–1)
Eosinophils Absolute: 0.1 10*3/uL (ref 0.0–0.7)
Eosinophils Relative: 1 % (ref 0–5)
Lymphocytes Relative: 17 % (ref 12–46)
Lymphs Abs: 2.1 10*3/uL (ref 0.7–4.0)
Monocytes Absolute: 1.5 10*3/uL — ABNORMAL HIGH (ref 0.1–1.0)
Monocytes Relative: 13 % — ABNORMAL HIGH (ref 3–12)
Neutro Abs: 8.2 10*3/uL — ABNORMAL HIGH (ref 1.7–7.7)
Neutrophils Relative %: 68 % (ref 43–77)

## 2010-03-31 LAB — MRSA PCR SCREENING: MRSA by PCR: NEGATIVE

## 2010-03-31 LAB — OCCULT BLOOD, POC DEVICE: Fecal Occult Bld: NEGATIVE

## 2010-03-31 LAB — URINALYSIS, ROUTINE W REFLEX MICROSCOPIC
Ketones, ur: 15 mg/dL — AB
Leukocytes, UA: NEGATIVE
Nitrite: POSITIVE — AB
Protein, ur: 100 mg/dL — AB
Specific Gravity, Urine: >1.03 — ABNORMAL HIGH (ref 1.005–1.030)
Urine Glucose, Fasting: 100 mg/dL — AB
Urobilinogen, UA: 1 mg/dL (ref 0.0–1.0)
pH: 5.5 (ref 5.0–8.0)

## 2010-03-31 LAB — TROPONIN I: Troponin I: 0.04 ng/mL (ref 0.00–0.06)

## 2010-03-31 LAB — PHOSPHORUS: Phosphorus: 2.5 mg/dL (ref 2.3–4.6)

## 2010-03-31 LAB — LIPASE, BLOOD: Lipase: 32 U/L (ref 11–59)

## 2010-03-31 LAB — LIPID PANEL
Cholesterol: 155 mg/dL (ref 0–200)
HDL: 34 mg/dL — ABNORMAL LOW (ref >39)
LDL Cholesterol: 112 mg/dL — ABNORMAL HIGH (ref 0–99)
Total CHOL/HDL Ratio: 4.6 RATIO
Triglycerides: 47 mg/dL (ref <150)
VLDL: 9 mg/dL (ref 0–40)

## 2010-03-31 LAB — TYPE AND SCREEN
ABO/RH(D): A POS
Antibody Screen: NEGATIVE

## 2010-03-31 LAB — PROTIME-INR
INR: >10 (ref 0.00–1.49)
Prothrombin Time: 79.7 seconds — ABNORMAL HIGH (ref 11.6–15.2)

## 2010-03-31 LAB — MAGNESIUM: Magnesium: 1.5 mg/dL (ref 1.5–2.5)

## 2010-03-31 LAB — BRAIN NATRIURETIC PEPTIDE: Pro B Natriuretic peptide (BNP): 215 pg/mL — ABNORMAL HIGH (ref 0.0–100.0)

## 2010-04-03 ENCOUNTER — Encounter: Payer: Self-pay | Admitting: Cardiology

## 2010-04-04 ENCOUNTER — Encounter: Payer: Self-pay | Admitting: Orthopaedic Surgery

## 2010-04-05 LAB — CBC
HCT: 40.2 % (ref 36.0–46.0)
HCT: 33 % — ABNORMAL LOW (ref 36.0–46.0)
HCT: 39 % (ref 36.0–46.0)
HCT: 38.2 % (ref 36.0–46.0)
Hemoglobin: 13.8 g/dL (ref 12.0–15.0)
Hemoglobin: 11.2 g/dL — ABNORMAL LOW (ref 12.0–15.0)
Hemoglobin: 13.1 g/dL (ref 12.0–15.0)
MCH: 30.7 pg (ref 26.0–34.0)
MCH: 30.5 pg (ref 26.0–34.0)
MCH: 30.5 pg (ref 26.0–34.0)
MCH: 30.5 pg (ref 26.0–34.0)
MCHC: 34.3 g/dL (ref 30.0–36.0)
MCHC: 34.2 g/dL (ref 30.0–36.0)
MCV: 89.5 fL (ref 78.0–100.0)
MCV: 89.9 fL (ref 78.0–100.0)
MCV: 89.8 fL (ref 78.0–100.0)
MCV: 89.1 fL (ref 78.0–100.0)
Platelets: 231 10*3/uL (ref 150–400)
Platelets: 223 10*3/uL (ref 150–400)
RBC: 3.67 MIL/uL — ABNORMAL LOW (ref 3.87–5.11)
RBC: 4.34 MIL/uL (ref 3.87–5.11)
RBC: 4.63 MIL/uL (ref 3.87–5.11)
RDW: 13.2 % (ref 11.5–15.5)
WBC: 8.8 10*3/uL (ref 4.0–10.5)
WBC: 8.5 10*3/uL (ref 4.0–10.5)

## 2010-04-05 LAB — COMPREHENSIVE METABOLIC PANEL
ALT: 20 U/L (ref 0–35)
ALT: 16 U/L (ref 0–35)
AST: 21 U/L (ref 0–37)
Alkaline Phosphatase: 70 U/L (ref 39–117)
CO2: 25 mEq/L (ref 19–32)
Chloride: 102 mEq/L (ref 96–112)
Chloride: 103 mEq/L (ref 96–112)
GFR calc Af Amer: >60 mL/min (ref >60)
GFR calc non Af Amer: 51 mL/min — ABNORMAL LOW (ref >60)
Glucose, Bld: 142 mg/dL — ABNORMAL HIGH (ref 70–99)
Potassium: 3.5 mEq/L (ref 3.5–5.1)
Potassium: 3.4 mEq/L — ABNORMAL LOW (ref 3.5–5.1)
Sodium: 138 mEq/L (ref 135–145)
Sodium: 139 mEq/L (ref 135–145)
Total Bilirubin: 2.2 mg/dL — ABNORMAL HIGH (ref 0.3–1.2)
Total Bilirubin: 2.4 mg/dL — ABNORMAL HIGH (ref 0.3–1.2)
Total Protein: 6 g/dL (ref 6.0–8.3)

## 2010-04-05 LAB — GLUCOSE, CAPILLARY
Glucose-Capillary: 110 mg/dL — ABNORMAL HIGH (ref 70–99)
Glucose-Capillary: 204 mg/dL — ABNORMAL HIGH (ref 70–99)
Glucose-Capillary: 130 mg/dL — ABNORMAL HIGH (ref 70–99)

## 2010-04-05 LAB — PROTIME-INR: Prothrombin Time: 26.3 seconds — ABNORMAL HIGH (ref 11.6–15.2)

## 2010-04-05 LAB — URINE CULTURE
Colony Count: >=100000
Culture  Setup Time: 201201171625

## 2010-04-05 LAB — DIFFERENTIAL
Lymphocytes Relative: 26 % (ref 12–46)
Lymphs Abs: 2.3 10*3/uL (ref 0.7–4.0)
Neutrophils Relative %: 55 % (ref 43–77)

## 2010-04-05 LAB — BASIC METABOLIC PANEL
BUN: 9 mg/dL (ref 6–23)
CO2: 26 mEq/L (ref 19–32)
Chloride: 100 mEq/L (ref 96–112)
Creatinine, Ser: 0.94 mg/dL (ref 0.4–1.2)
Glucose, Bld: 123 mg/dL — ABNORMAL HIGH (ref 70–99)

## 2010-04-05 LAB — PREPARE FRESH FROZEN PLASMA
Unit division: 0
Unit division: 0

## 2010-04-05 LAB — TSH: TSH: 0.301 u[IU]/mL — ABNORMAL LOW (ref 0.350–4.500)

## 2010-04-05 LAB — HEMOGLOBIN A1C: Hgb A1c MFr Bld: 7 % — ABNORMAL HIGH (ref <5.7)

## 2010-04-05 LAB — T3, FREE: T3, Free: 2.2 pg/mL — ABNORMAL LOW (ref 2.3–4.2)

## 2010-04-05 NOTE — Discharge Summary (Addendum)
[name], [name]               ACCOUNT NO.:  [id number]  MEDICAL RECORD NO.:  [id number]          PATIENT TYPE:  INP  LOCATION:  5506                         FACILITY:  [facility]  PHYSICIAN:  Tarry Kos, MD       DATE OF BIRTH:  January 04, 1924  DATE OF ADMISSION:  03/30/2010 DATE OF DISCHARGE:  04/02/2010                              DISCHARGE SUMMARY   DISCHARGE DIAGNOSES: 1. Abdominal pain. 2. Supratherapeutic INR. 3. Likely right renal hematoma secondary to the above issues. 4. History of atrial fibrillation on chronic anticoagulation secondary     to her atrial fibrillation.  SUMMARY OF HOSPITAL COURSE:  [name] is a very pleasant 77 year old female who presented to the emergency room on March 30, 2010 because of abdominal pain.  She had a CT of her abdomen and pelvis, which subsequently showed high density lesion in the inferior aspect of the right renal hilum measuring 2.6 x 2.3 x 2.3 cm suspicious for hemorrhage versus transitional cell carcinoma.  Also during this time, she was found to have an INR of over 10.  She received reversal of her coagulopathy with fresh frozen plasma and vitamin K and her INR did come down to below 2.  Her primary care physician has recently adjusted her Coumadin doses and has increased it over the last 2-3 weeks as this is the likely cause of her supratherapeutic INR.  She was monitored closely.  Actually her abdominal pain resolved which is probably due to the likely renal hematoma.  Urology was consulted.  Her hemoglobin was monitored closely also and has remained stable and it continues to be stable at 13.1 today.  She did have a urinary tract infection which grew out E. coli which was sensitive to ceftriaxone.  She was covered with ceftriaxone while she was here and switched over to Leonore at discharge. Dr. Brunilda Payor saw the patient and actually recommended for her to follow up with urology next week at which point they are going to do  a cystoretrograde pyelogram ureteroscopy for further evaluation of this questionable mass.  We did try to repeat the CAT scan of her abdomen and pelvis while she was here but we had very difficult time getting in IV. She had lost her IV and could not get another one after about 10-15 attempts by various staff members and it was decided that since her hemoglobin was stable that we would not repeat this CAT scan at this time.  However, this will need to be repeated probably in 2-4 weeks.  I am going to hold her Coumadin due to this hemorrhage issue.  She needs to have her Coumadin held for at least 2 weeks and has been brought up also by cardiology to possibly switch her to Pradaxa, however, I would not recommend switching her to Pradaxa being that we have no reversal agents for Pradaxa and due to her age, she is at increased risk of bleeding with Pradaxa.  I would resume Coumadin at a lower dose in 2-4 weeks and monitor very closely for any rebleeding issues.  Her renal function has remained normal.  She is  being discharged today to followup again with her primary care physician in 1-2 weeks and with urology next week for further monitoring of this renal hematoma/mass.  She has seen Dr. Isabel Caprice in the past with urology.  PHYSICAL EXAMINATION:  VITAL SIGNS:  She has been afebrile.  Her vital signs have been stable. GENERAL:  She has been alert and oriented, no apparent distress, cooperative __________. HEART:  Regular rate and rhythm without murmurs or gallops. CHEST:  Clear to auscultation bilaterally.  No wheezes or rhonchi heard. ABDOMEN:  Soft, nontender, nondistended.  Positive bowel sounds.  No hepatosplenomegaly. EXTREMITIES:  No clubbing, cyanosis, or edema. PSYCH:  Normal mood and affect. NEURO:  No focal neurological deficits.  DISCHARGE MEDICATIONS: 1. Diltiazem 240 mg daily. 2. Levothyroxine 100 mcg daily. 3. Ramipril 2.5 mg daily. 4. Vitamin D3 2000 international units  daily. 5. Imdur 30 mg daily. 6. Vantin 200 mg twice a day for 5 more days to complete a 7-day     course of antibiotics for E. coli UTI. 7. Coumadin is on hold for at least 2 weeks, needs to be reassessed     and restarted in the future for chronic AFib, stroke prevention.  I also did talk to her son and updated him and discussed with him the followup appointment she will need.  He understands and will aid his mother in doing so.          ______________________________ Tarry Kos, MD     RD/MEDQ  D:  04/02/2010  T:  04/03/2010  Job:  [id number]  Electronically Signed by Eldridge Dace MD on 04/05/2010 02:05:27 PM

## 2010-04-07 ENCOUNTER — Emergency Department (HOSPITAL_COMMUNITY)
Admission: EM | Admit: 2010-04-07 | Discharge: 2010-04-07 | Payer: Self-pay | Source: Home / Self Care | Admitting: Emergency Medicine

## 2010-04-07 LAB — URINALYSIS, ROUTINE W REFLEX MICROSCOPIC
Hgb urine dipstick: NEGATIVE
Nitrite: NEGATIVE
Protein, ur: NEGATIVE mg/dL
Urobilinogen, UA: 1 mg/dL (ref 0.0–1.0)

## 2010-04-07 LAB — POCT I-STAT, CHEM 8
BUN: 20 mg/dL (ref 6–23)
Calcium, Ion: 1.08 mmol/L — ABNORMAL LOW (ref 1.12–1.32)
Chloride: 105 mEq/L (ref 96–112)
Creatinine, Ser: 1.2 mg/dL (ref 0.4–1.2)
Glucose, Bld: 138 mg/dL — ABNORMAL HIGH (ref 70–99)
HCT: 38 % (ref 36.0–46.0)

## 2010-04-07 LAB — PROTIME-INR: Prothrombin Time: 15.9 seconds — ABNORMAL HIGH (ref 11.6–15.2)

## 2010-04-16 NOTE — Consult Note (Addendum)
[name], [name]               ACCOUNT NO.:  [id number]  MEDICAL RECORD NO.:  [id number]          PATIENT TYPE:  INP  LOCATION:  5506                         FACILITY:  [facility]  PHYSICIAN:  Danae Chen, M.D.  DATE OF BIRTH:  [birth year]  DATE OF CONSULTATION:  04/01/2010 DATE OF DISCHARGE:                                CONSULTATION   REASON FOR CONSULTATION:  Right renal hematoma.  The patient is an 77 years old female who went to the emergency room on March 30, 2010, with a 4-5 days history of midline abdominal pain mainly in the epigastric area and right upper quadrant.  The pain is associated with some nausea, but no vomiting.  She denies any voiding symptoms, and she does not have frequency, urgency, or gross hematuria. She has been on Coumadin for atrial fibrillation.  In the ER, her INR was found to be greater than 10.  A CT scan of the abdomen and pelvis showed a 2.6 x 2.3 x 2.3 cm lesion in the inferior aspect of the right renal hilum suspicious for hemorrhage versus a tumor.  The patient does not smoke and does not have any history of kidney stones.  The urinalysis showed too numerous to count red blood cells and 0-2 wbc's and many bacteria.  PAST MEDICAL HISTORY:  Positive for history of heart disease.  She had a heart attack in the past and she has chronic atrial fibrillation.  She had coronary artery stent placement in the past.  She has a history of breast cancer, diabetes, hypertension, and hypothyroidism.  PAST SURGICAL HISTORY:  She had a cholecystectomy, hemorrhoidectomy, hysterectomy, lumpectomy for breast cancer, and the breast cancer was treated with radiation and chemotherapy also.  SOCIAL HISTORY:  She is married, has two children.  Does not smoke nor drink.  FAMILY HISTORY:  Her mother died at age 69.  Her father had tuberculosis.  She had one sister who had tuberculosis and she is now deceased.  She has two brothers living, and she had one  brother who died of Alzheimer disease.  MEDICATIONS:  She is on: 1. Diltiazem 240 mg a day. 2. Isosorbide 30 mg a day. 3. Ramipril 2.5 mg. 4. Aspirin 81 mg. 5. Warfarin 2.5 mg and 3.75 mg on Mondays, Wednesdays and Fridays. 6. Synthroid 100 mcg. 7. Simvastatin 40 mg.  ALLERGIES:  No known drug allergies, but METFORMIN causes diarrhea.  REVIEW OF SYSTEMS:  As noted in the HPI and everything else is negative.  PHYSICAL EXAMINATION:  GENERAL:  This is a well-developed 77 years old female who is in no acute distress at this time.  She still has moderate epigastric and right upper quadrant pain.  She is alert and oriented to time, place, and person. VITAL SIGNS:  Blood pressure is 133/70, pulse 71, respirations 20, temperature 98.2. SKIN:  Warm and dry. HEENT:  Her head is normal.  She has pink conjunctivae.  Ears, nose, and throat are within normal limits. NECK:  Supple.  No cervical adenopathy.  No thyromegaly. CHEST:  Symmetrical.  No labored breathing. HEART:  Regular rhythm. ABDOMEN:  Soft.  She has mild tenderness in the epigastric area and in the right upper quadrant.  She has no CVA tenderness.  Kidneys are not palpable.  She has no hepatomegaly, no splenomegaly.  Her bladder is not distended.  She has no inguinal hernia.  No inguinal adenopathy. EXTREMITIES:  Within normal limits.  I reviewed the CT scan and the findings are as noted above.  Urine culture is positive for E. coli.  Her BUN today is 14, creatinine 1.03. Hemoglobin is 13.1, hematocrit 39.0, and WBC 8.8.  The patient received 2 units of fresh frozen plasma, and her INR is now down to 2.01.  IMPRESSION:  Renal or perirenal hematoma, question renal tumor, urinary tract infection, atrial fibrillation, coronary artery disease, status post left lumpectomy for breast cancer treated also with radiation and chemotherapy.  SUGGESTIONS:  Repeat CT scan for reevaluation of the renal hematoma.  If the lesion has  not changed, the patient will need a cystoscopy, retrograde pyelogram, and ureteroscopy for further evaluation, when the urine is sterile. Treat urinary tract infection with culture specific antibiotics.  We will follow the patient with you.  Dr. Isabel Caprice has seen the patient in the past, and I will ask him to follow her up.     Danae Chen, M.D.     MN/MEDQ  D:  04/01/2010  T:  04/02/2010  Job:  [id number]  Electronically Signed by Lindaann Slough M.D. on 04/16/2010 01:03:41 PM

## 2010-04-17 ENCOUNTER — Emergency Department (HOSPITAL_COMMUNITY)
Admission: EM | Admit: 2010-04-17 | Discharge: 2010-04-17 | Disposition: A | Discharge status: Home / Self Care | Payer: Medicare Other | Attending: Emergency Medicine | Admitting: Emergency Medicine

## 2010-04-17 DIAGNOSIS — R350 Frequency of micturition: Secondary | ICD-10-CM | POA: Insufficient documentation

## 2010-04-17 DIAGNOSIS — I252 Old myocardial infarction: Secondary | ICD-10-CM | POA: Insufficient documentation

## 2010-04-17 DIAGNOSIS — I4891 Unspecified atrial fibrillation: Secondary | ICD-10-CM | POA: Insufficient documentation

## 2010-04-17 DIAGNOSIS — R300 Dysuria: Secondary | ICD-10-CM | POA: Insufficient documentation

## 2010-04-17 DIAGNOSIS — Z7901 Long term (current) use of anticoagulants: Secondary | ICD-10-CM | POA: Insufficient documentation

## 2010-04-17 LAB — URINALYSIS, ROUTINE W REFLEX MICROSCOPIC
Ketones, ur: NEGATIVE mg/dL
Nitrite: NEGATIVE
Protein, ur: NEGATIVE mg/dL
Urine Glucose, Fasting: NEGATIVE mg/dL

## 2010-04-17 LAB — CBC
MCV: 90.1 fL (ref 78.0–100.0)
Platelets: 269 10*3/uL (ref 150–400)
RDW: 13.6 % (ref 11.5–15.5)
WBC: 7.1 10*3/uL (ref 4.0–10.5)

## 2010-04-17 LAB — DIFFERENTIAL
Eosinophils Absolute: 0.3 10*3/uL (ref 0.0–0.7)
Eosinophils Relative: 4 % (ref 0–5)
Lymphs Abs: 2.6 10*3/uL (ref 0.7–4.0)

## 2010-04-17 LAB — BASIC METABOLIC PANEL
BUN: 17 mg/dL (ref 6–23)
Calcium: 9 mg/dL (ref 8.4–10.5)
Creatinine, Ser: 1.06 mg/dL (ref 0.4–1.2)
GFR calc non Af Amer: 49 mL/min — ABNORMAL LOW (ref >60)
Potassium: 3.8 mEq/L (ref 3.5–5.1)

## 2010-04-19 LAB — URINE CULTURE: Colony Count: 10000

## 2010-04-23 NOTE — Consult Note (Signed)
[name], [name] NO.:  [id number]  MEDICAL RECORD NO.:  [id number]          PATIENT TYPE:  INP  LOCATION:  2002                         FACILITY:  [facility]  PHYSICIAN:  Nanetta Batty, M.D.   DATE OF BIRTH:  December 02, 1923  DATE OF CONSULTATION:  03/31/2010 DATE OF DISCHARGE:                                CONSULTATION   REASON FOR CONSULTATION:  Asked by [facility] hospitalist to see 77 year old white female concerning need to continue Coumadin on long-term basis.  HISTORY OF PRESENT ILLNESS:  The patient was admitted on March 30, 2010, after complaining of mild abdominal pain with occasional chills and low-grade temp/fever.  She presented to the ER with this complaint. Her INR was greater than 10.  She is on long-term anticoagulation secondary to chronic AFib.  CT of her abdomen revealed a 2.6 x 2.3 x 2.3- cm high density lesion in the inferior aspect of the right renal hilum, concerning for hematoma versus transitional cell carcinoma.  The patient received 2 units of fresh frozen plasma on March 30, 2010, and her INR is down from greater than 10 to 2.40 today with a hemoglobin of 13.8 and hematocrit of 40.2.  PAST MEDICAL HISTORY: 1. Chronic AFib, on chronic Coumadin. 2. History of MI with stent to the circumflex, her last cath was in     2008 and the stent to the LAD and circ were patent.  She had an     echo in 2008.  EF was 50%.  Her last nuclear study was in January     2011 with mild ischemia in the circumflex area but no significant     change from prior studies. 3. History of Barrett esophagus. 4. History of dilatation of esophageal stricture in 2004. 5. History of hiatal hernia. 6. She has had a lap chole. 7. Hysterectomy. 8. Status post left breast lumpectomy and treated with chemo for     breast cancer. 9. Type 2 diabetes mellitus. 10.She has hypertension and hypothyroidism. Her last INR done on March 10, 2010, was 3.7 and her Coumadin  was held, and she had been on Coumadin 2.5 mg or last dose of that was 2.5 mg daily except for 3.75 mg on Monday, Wednesday, and Friday.  ALLERGIES:  METFORMIN causes diarrhea.  OUTPATIENT MEDICATIONS: 1. Imdur 30 mg daily. 2. Vitamin D3 - 2000 units daily. 3. Coumadin as previously stated. 4. Ramipril 2.5 mg daily. 5. Levothyroxine 100 mcg daily. 6. Diltiazem CD 240 mg daily. Please note, on the home medication reconciliation sheet, it states she was on 2.5 mg of warfarin 3 days a week and 5 mg daily other days, so if she had increased her dose that is the higher dose than we had actually asked her to be on.  FAMILY HISTORY:  Not significant to this admission.  SOCIAL HISTORY:  She is married, lives with her husband in an independent section of [facility] in [place].  She is independent with her ADLs.  Denies tobacco, alcohol, or illicit drug use.  REVIEW OF SYSTEMS:  GASTROINTESTINAL:  Abdominal pain as stated. CARDIOVASCULAR:  No chest pain, no  shortness of breath.  NEUROLOGIC:  No syncope.  No dizziness.  ENDOCRINE:  Positive for hypothyroidism.  PHYSICAL EXAMINATION:  VITAL SIGNS:  Blood pressure 116/79, pulse 86, respiratory rate 18, temperature 97.3, oxygen saturation 97%. GENERAL:  An alert, oriented white female, in no acute distress today. SKIN:  Warm and dry. HEENT:  Normocephalic.  Sclerae clear. NECK:  A 2+ carotids. HEART:  Heart rate irregularly irregular but rate controlled. LUNGS:  Clear. ABDOMEN:  Soft. EXTREMITIES:  Without edema.  An EKG today on admission is actually AFib with a rate of 80-106 and no acute changes.  IMPRESSION: 1. Abdominal pain secondary to either renal hematoma versus a mass,     though hematoma most likely source. 2. Chronic atrial fibrillation, on Coumadin. 3. Anticoagulation with INR of greater than 10 on admission, not sure     she was taking her Coumadin correctly.  We had her on a lower dose     than she stated to the  medication reconciliation people. 4. Coronary artery disease with history of stent. 5. History of Barrett esophagus.  PLAN:  The patient has high risk of CVA off anticoagulation.  She may actually do better on Pradaxa, question is timing of restarting the anticoagulation given bleed, Renal input may be beneficial at this time.  Please note, she also has a urinary tract infection and is on Rocephin. Dr. Allyson Sabal saw her and evaluated her.     Darcella Gasman. Annie Paras, N.P.   ______________________________ Nanetta Batty, M.D.    LRI/MEDQ  D:  03/31/2010  T:  04/01/2010  Job:  [id number]  Electronically Signed by Nada Boozer N.P. on 04/02/2010 07:10:21 PM Electronically Signed by Nanetta Batty M.D. on 04/23/2010 08:06:40 AM

## 2010-05-08 NOTE — H&P (Signed)
[name], [name]               ACCOUNT NO.:  [id number]  MEDICAL RECORD NO.:  [id number]          PATIENT TYPE:  EMS  LOCATION:  MAJO                         FACILITY:  [facility]  PHYSICIAN:  Richarda Overlie, MD       DATE OF BIRTH:  07/17/1923  DATE OF ADMISSION:  03/30/2010 DATE OF DISCHARGE:                             HISTORY & PHYSICAL   PRIMARY CARE PHYSICIAN:  Hal T. Stoneking, M.D.  PRIMARY CARDIOLOGIST:  Dr. Nicki Guadalajara.  CHIEF COMPLAINT:  Abdominal pain.  SUBJECTIVE:  This is an 77 year old female, who presents to the ED today with a 4-5 day history of midline abdominal pain, located mostly around the periumbilical area radiating into the right lower quadrant associated with some mild nausea, but no episodes of vomiting.  The patient has occasionally had chills and a low grade temperature. However, she denies any obvious signs of bleeding.  She denies any blood in her stool or black tarry stools, hemoptysis or hematemesis.  In the ED, the patient was found to have an INR of greater than 10.  She also had a CT scan of the abdomen and pelvis done to further evaluate her abdominal pain and the patient has a 2.6 x 2.3 x 2.3 cm high density lesion in the inferior aspect of the right renal hilum, suspicion for a hemorrhage versus transitional cell cancer.  The patient is being admitted for further evaluation.  PAST MEDICAL HISTORY: 1. Chronic bilateral mild hydronephrosis. 2. Paroxysmal atrial fibrillation on chronic anticoagulation. 3. History of recurrent UTIs. 4. History of myocardial infarction, status post stenting to the     circumflex artery.  Last cardiac catheterization in July 2008, by     Dr. Nicki Guadalajara that showed widely patent stent in the proximal     left anterior descending artery.  She also had angioplasty and     stenting of the distal circumflex with 90% and 100% stenosis.  The     patient is currently only on aspirin and is off Plavix. 5. History of  Barrett's esophagus per EGD by Dr. Sabino Gasser in     December 2008.  Internal hemorrhoids and cecal polyp on colonoscopy     by Dr. Virginia Rochester in December 2008.  History of duodenal ulcer,     gastrointestinal bleeding, NSAID induced in October 2004. 6. Status post dilatation of esophageal stricture in October 2004, by     Dr. Leone Payor. 7. History of hiatal hernia. 8. Status post laparoscopic cholecystectomy. 9. Status post hysterectomy. 10.Status post left breast lumpectomy and treatment with chemotherapy     for left sided breast cancer in April 2006. 11.Type 2 diabetes. 12.Hypertension. 13.Hypothyroidism. 14.Acute myocardial infarction in July 2008, status post bypass     stenting x2 to the circumflex artery by Dr. Tresa Endo in July 2008.  ALLERGIES:  METFORMIN THAT CAUSES DIARRHEA.  HOME MEDICATIONS:  Coumadin, aspirin, vitamin D, Benicar, diltiazem, levothyroxine, isosorbide mononitrate.  SOCIAL HISTORY:  The patient is married and lives with her husband in an independent section of the [facility] in [place].  She is independent with her ADLs.  Denies use of  tobacco, alcohol or illicit drug use.  FAMILY HISTORY:  History of parents being deceased.  Father died of complications of tuberculosis at the age of 59.  Her mother died of pneumonia at 26 years of age.  REVIEW OF SYSTEMS:  Complete review of systems was done as documented in HPI.  PHYSICAL EXAMINATION:  VITAL SIGNS:  Blood pressure 141/85, pulse of 94, respirations 18, temperature 98.5. GENERAL:  The patient is alert, oriented and comfortable in no acute cardiopulmonary distress. HEENT:  Normocephalic, nontraumatic.  Pupils are equal and reactive. Sclerae anicteric. NECK:  Supple, obese without any thyromegaly.  No bruit.  No JVD. LUNGS:  Clear to auscultation bilaterally.  No wheezing, no crackles, no rhonchi. CARDIOVASCULAR:  Irregularly irregular with a 1/6 systolic ejection murmur. ABDOMEN:  Obese, soft and  tender to palpation only in the suprapubic and periumbilical area.  No CVA tenderness. EXTREMITIES:  Trace pedal edema bilateral lower extremities. NEUROLOGIC:  Cranial nerves II-XII grossly intact.  Strength intact in bilateral upper and lower extremities. PSYCHIATRIC:  Appropriate mood and affect. LYMPHATICS:  No axillary, inguinal or cervical lymphadenopathy.  LABORATORY DATA:  PT is 79.7, INR greater than 10.  Urinalysis shows RBCs too numerous to count with positive amount of nitrite.  Lipase 32. Sodium 138, potassium 4.0, chloride 104, bicarb 23, glucose 169, BUN 26, creatinine 1.14, total bili 1.7, AST 22, ALT 19.  Calcium 9.1.  WBC 12.0, hemoglobin 15.0, hematocrit 44.4 and a platelet count of 237.  Fecal occult blood is negative.  ASSESSMENT AND PLAN: 1. Supratherapeutic INR.  The patient states that her Coumadin dose     was recently increased before Christmas in December.  The exact     dose needs to be obtained from Dr. Evlyn Clines office in the     morning.  The patient has received fresh frozen plasma and 5 mg of     vitamin K.  We will repeat the INR tonight and again in the morning     to ensure that the INR is improving.  If the INR is still high in     the morning, the patient may need another dose of vitamin K p.o.     It should also be verified with Dr. Tresa Endo and Dr. Brunilda Payor, who will be     seeing her for her perinephric bleeding if the patient is a     candidate for long-term anticoagulation.  Another alternative might     be to hold the Coumadin for about 2-3 weeks and restart the     Coumadin once the patient has repeat imaging of her right kidney.     The question of whether the patient may have transitional cell     carcinoma on not and I feel that a repeat imaging study would be     necessary.  However, I would defer this to Dr. Brunilda Payor to decide when     repeat imaging study would be useful. 2. Atrial fibrillation rate-controlled.  Continue the patient on      diltiazem 3. Coronary artery disease.  We will hold the patient's aspirin in the     setting of her ongoing perinephric hematoma and gross hematuria.     The patient has no other signs of bleeding.  Aspirin can be     restarted once the patient's INR has improved. 4. Hypertension.  Currently, the patient's blood pressure is stable.     We will hold around the ramipril in the setting  of her ongoing bleeding. 5. Sequential compression devices for deep venous thrombosis     prophylaxis.  Dr. Brunilda Payor has been consulted by the emergency room     physician.  She is a full code.     Richarda Overlie, MD     NA/MEDQ  D:  03/30/2010  T:  03/30/2010  Job:  [id number]  Electronically Signed by Richarda Overlie MD on 05/08/2010 01:49:17 PM

## 2010-07-27 NOTE — Op Note (Signed)
NAMEPRESCIOUS, HURLESS               ACCOUNT NO.:  [id number]   MEDICAL RECORD NO.:  [id number]          PATIENT TYPE:  AMB   LOCATION:  ENDO                         FACILITY:  [facility]   PHYSICIAN:  Georgiana Spinner, M.D.    DATE OF BIRTH:  06-20-1923   DATE OF PROCEDURE:  03/01/2007  DATE OF DISCHARGE:                               OPERATIVE REPORT   PROCEDURE:  Upper endoscopy.   INDICATIONS:  GERD.   ANESTHESIA:  Fentanyl 25 mcg, Versed 4 mg.   DESCRIPTION OF PROCEDURE:  With the patient mildly sedated in the left  lateral decubitus position, a Pentax videoscopic endoscope was inserted  in the mouth, passed under direct vision through the esophagus, which  appeared normal, until we reached distal esophagus.  There appeared to  be one small area of possible Barrett's photographed and biopsied.  We  entered into the stomach through a hiatal hernia.  Fundus, body, antrum,  duodenal bulb, second portion of duodenum appeared normal.   From this point the endoscope was slowly withdrawn taking  circumferential views of the duodenal mucosa until the endoscope had  been pulled back, and the stomach placed in retroflexion to view the  stomach from below.  The endoscope was straightened and withdrawn taking  circumferential views of the remaining gastric and esophageal mucosa.  The patient's vital signs, pulse oximeter remained stable.  The patient  tolerated the procedure well without apparent complications.   FINDINGS:  Question of Barrett's esophagus, await biopsy report.  The  patient will call me for results and follow up with me as needed as an  outpatient.  Proceed to colonoscopy as planned.           ______________________________  Georgiana Spinner, M.D.     GMO/MEDQ  D:  03/01/2007  T:  03/02/2007  Job:  [id number]   cc:   Samul Dada, M.D.  Fax: [phone]

## 2010-07-27 NOTE — Op Note (Signed)
NAMEKHAMILA, BASSINGER               ACCOUNT NO.:  [id number]   MEDICAL RECORD NO.:  [id number]          PATIENT TYPE:  AMB   LOCATION:  ENDO                         FACILITY:  [facility]   PHYSICIAN:  Georgiana Spinner, M.D.    DATE OF BIRTH:  1923/09/26   DATE OF PROCEDURE:  03/01/2007  DATE OF DISCHARGE:                               OPERATIVE REPORT   PROCEDURE:  Colonoscopy.   INDICATIONS:  Colon polyps, colon cancer screening.   ANESTHESIA:  Fentanyl 25 mcg, Versed 1 mg.   PROCEDURE:  With the patient mildly sedated in left lateral decubitus  position, the Pentax videoscopic colonoscope was inserted in the rectum,  passed under direct vision to the cecum, identified by ileocecal valve  and appendiceal orifice, both which were photographed.  From this point,  the colonoscope was slowly withdrawn, taking circumferential views of  colonic mucosa, stopping only at one fold removed from the cecum were  polyp was seen photographed and removed using hot biopsy forceps  technique, setting of 20/150 blended current.  We next stopped in the  rectum which appeared normal on direct and showed hemorrhoids on  retroflexed view.  The endoscope was straightened and withdrawn.  The  patient's vital signs and pulse oximeter remained stable.  The patient  tolerated the procedure well without apparent complication.   FINDINGS:  Internal hemorrhoids, small polyp of cecal area.  Await  biopsy report.  The patient will call me for results and follow-up with  me as needed as an outpatient.           ______________________________  Georgiana Spinner, M.D.     GMO/MEDQ  D:  03/01/2007  T:  03/02/2007  Job:  [id number]   cc:   Samul Dada, M.D.  Fax: [phone]

## 2010-07-27 NOTE — Discharge Summary (Signed)
NAMEBRYLYN, [name] NO.:  [id number]   MEDICAL RECORD NO.:  [id number]          PATIENT TYPE:  INP   LOCATION:  2035                         FACILITY:  [facility]   PHYSICIAN:  Rebecca Char, MD    DATE OF BIRTH:  23-Apr-1923   DATE OF ADMISSION:  09/22/2006  DATE OF DISCHARGE:  09/29/2006                               DISCHARGE SUMMARY   DISCHARGE DIAGNOSES:  1. SEMI this admission, status post Cypher stenting x2 to the      circumflex September 22, 2006.  2. Known coronary disease with prior RCA and LAD stent.  3. PAF, Coumadin added this admission with an INR goal of 2.0.  4. Treated hypothyroidism, she is on Synthroid with a low TSH but      normal T3 and T4.  5. Non-insulin-dependent diabetes.  6. Treated hypertension.  7. Treated dyslipidemia.   HOSPITAL COURSE:  The patient is an 77 year old female with a history of  prior LAD and RCA intervention.  She has also had a circumflex stent in  the past.  She presented September 22, 2006, with unstable angina.  Troponins  were positive with a troponin of 0.12.  She is followed by Dr. Aleen [name].  EKG also showed atrial fibrillation.  The patient was taken to the cath  lab by Dr. Tresa [name].  Catheterization revealed no restenosis in the  previously placed RCA stent, no restenosis in the previously placed LAD  stent and no restenosis of the prior circumflex stent.  She did have  distal circumflex disease.  She underwent two Cypher stent placements by  Dr. Tresa [name] with good results.  She tolerated this well.  CKs went to 1079  with 200 MBs and troponin of 37.  It was decided to keep the patient on  IV heparin and start Coumadin as the patient has had atrial fibrillation  during this admission and after MI.  Dr. Aleen [name] wants her on aspirin,  Plavix and Coumadin with an INR goal of 2.0.  Dr. Aleen [name] followed her  until the 16th, and then we took over her care while he was out of town.  We feel she can be discharged September 29, 2006.   She is in atrial  fibrillation at discharged with controlled ventricular response.  Her  INR at discharge is 1.9.   DISCHARGE MEDICATIONS:  1. Plavix 75 mg a day.  2. Lovastatin 80 mg a day.  3. Actos 15 mg a day.  4. Zetia 10 mg a day.  5. Prevacid 30 mg a day.  6. Aspirin 81 mg a day.  7. Cardizem 180 mg a day.  8. Lopressor 50 mg b.i.d.  9. NuIron daily.  10.Benicar 20 mg a day.  11.Multivitamin daily.  12.Levothyroxine 150 mg a day.   LABORATORY DATA:  White count 6.8, hemoglobin 11.2, hematocrit 32.8,  platelet 220.  Sodium 139, potassium 4.0, BUN 10, creatinine of 0.7.  CKs peaked at 1123 with 220 MBs.  TSH was 0.092 with a T4 of 9.5 and T3  of 133.  BNP is 587, hemoglobin A1c 6.3.  Chest x-ray  on the 12th shows  cardiomegaly, interstitial edema.  INR at discharge is 1.9.   DISPOSITION:  The patient is discharged in stable condition.  She will  need follow-up of her thyroid tests as an outpatient by Dr. Aleen [name] or  her primary care doctor.  She will need a protime next week.  INR goal  will be 2.  We did cut her aspirin back to 81 mg a day.  Given  prescription for nitroglycerin at discharge.      [name], P.A.      Rebecca Char, MD  Electronically Signed    LKK/MEDQ  D:  09/29/2006  T:  09/30/2006  Job:  [id number]   cc:   Rebecca Char, MD  Massie Maroon, MD  Nicki Guadalajara, M.D.

## 2010-07-27 NOTE — Discharge Summary (Signed)
[name], SAILER NO.:  [id number]   MEDICAL RECORD NO.:  [id number]          PATIENT TYPE:  INP   LOCATION:  2005                         FACILITY:  [facility]   PHYSICIAN:  Hind I Elsaid, MD      DATE OF BIRTH:  1923-11-18   DATE OF ADMISSION:  10/14/2007  DATE OF DISCHARGE:  10/17/2007                               DISCHARGE SUMMARY   PRIMARY CARE PHYSICIAN:  Massie Maroon, MD   PRIMARY CARDIOLOGIST:  Nicki Guadalajara, MD   GASTROENTEROLOGIST:  Georgiana Spinner, MD   DISCHARGE DIAGNOSES:  1. Abdominal pain, nausea, vomiting, and diarrhea, are felt secondary      to gastroenteritis, resolved.  2. Elevated transaminitis improving.  3. Chronic bilateral mild hydronephrosis.  4. Paroxysmal atrial fibrillation.  5. On chronic anticoagulation.  6. Escherichia coli urinary tract infection.  7. History of myocardial infarction status post Cypher stenting to the      circumflex artery.  8. Hypothyroidism.  9. Hypertension.  10.Hyperlipidemia.  11.Barrett esophagus.  12.Internal hemorrhoid and cecal polyp.  13.History of duodenal ulcer and gastrointestinal bleeding.  14.Diabetes mellitus type 2.   DISCHARGE MEDICATIONS:  1. Coumadin 5 mg one tab Monday and Friday and 1/2 tab the other days.  2. Aspirin 81 mg 1/2 tab daily.  3. Plavix 75 mg daily.  4. Vitamin D 1 tab daily.  5. Benicar 40 mg daily.  6. Diltiazem 240 mg daily.  7. Januvia 100 mg daily.  8. Synthroid 0.137 mg daily.  9. Isosorbide mononitrate 30 mg daily.  10.Clonidine 0.1 mg daily.  11.Macrobid 100 mg twice daily for 3 days.   CONSULTATIONS:  None.   PROCEDURES:  1. Abdominal x-ray no acute abnormalities, cardiomegaly without      evidence of acute cardiopulmonary disease.  2. Ultrasound of the abdomen, status post cholecystectomy, normal      liver, common bile duct, normal sonographic appearance of the      spleen and pancreas and mild hydronephrosis of both kidneys.  3. CT abdomen and  pelvis no significant change since prior study.      Hiatal hernia, umbilical hernia containing only adipose tissue.  4. CT pelvis no acute intrapelvic morphology.   HISTORY OF PRESENT ILLNESS:  This is an 77 year old female with  significant history of coronary artery disease, paroxysmal AFib, type 2  diabetes mellitus, presented to the emergency room with a history of  abdominal pain, nausea, vomiting, and diarrhea without evidence of black  tarry stools.  The patient was admitted for evaluation.  1. Abdominal pain, nausea, and vomiting possible acute      gastroenteritis.  The patient was found to have white blood cells      of 9.  The patient was admitted to hospital and kept on clear      liquid diet.  C. diff was ordered, which was negative.  During      hospitalization, abdominal pain completely resolved, and the      patient tolerated regular diet.  It was felt nausea, vomiting,      abdominal pain, and  diarrhea secondary to acute gastroenteritis.  2. Elevated LFTs.  Possibly secondary to dehydration which      significantly improved today LFT is total bilirubin was 1.6,      alkaline phosphatase 100, which is normal, AST 66, and ALT 180.      The trend is going down.  Ultrasound of the liver and biliary      system was negative and CT abdomen was negative.  Also the patient      noted to have mild lipase elevation at 94 at the date of admission,      which normalized during hospitalization.  We have only one lipase      level, which is elevated, which could be secondary to dehydration      and hypotension.  Hepatitis profile at this time is pending.  As      her symptoms significantly improved, results can be followed as      outpatient, if indicated.  3. Escherichia coli, urinary tract infection, which is sensitive to      Macrobid.  The patient received 3 days of Rocephin.  The patient      will be discharged with Macrobid.  4. Paroxysmal atrial fibrillation.  The patient to  continue Cardizem      and anticoagulation and follow up with Dr. Nicki Guadalajara as an      outpatient.  The patient will be discharged with the same dose of      Coumadin.  5. History of myocardial infarction.  The patient was asymptomatic      during hospitalization and cardiac panels were negative for that      patient will continue her home medication.  6. Hypertension.  Blood pressure seems uncontrolled, small dose of      clonidine was added to the patient's medications.   DISPOSITION:  The patient is medically stable to be discharged.  Follow  with her primary care physician within 1 week.  Follow with Dr. Nicki Guadalajara with PT/INR.      Hind Bosie Helper, MD  Electronically Signed     HIE/MEDQ  D:  10/17/2007  T:  10/18/2007  Job:  [id number]

## 2010-07-27 NOTE — Cardiovascular Report (Signed)
NAMEPAM, Rebecca NO.:  [id number]   MEDICAL RECORD NO.:  [id number]          PATIENT TYPE:  INP   LOCATION:  2909                         FACILITY:  [facility]   PHYSICIAN:  Nicki Guadalajara, M.D.     DATE OF BIRTH:  1923/08/19   DATE OF PROCEDURE:  09/22/2006  DATE OF DISCHARGE:                            CARDIAC CATHETERIZATION   PROCEDURE:  Left heart catheterization:  Cine coronary angiography, cine  left ventriculography; percutaneous coronary intervention distal left  circumflex coronary artery with percutaneous transluminal cardiac  angioplasty and stenting; defibrillation.   INDICATIONS:  Ms. [name] is an 77 year old patient of Dr.  Aleen Campi who is status post remote stenting to her right coronary artery  by Dr. Aleen Campi..  She had been recently hospitalized in early June and  had positive enzymes.  Catheterization was done on August 15, 2006, by Dr.  Aleen Campi, and the patient was found to have high-grade stenosis in the  left circumflex coronary artery with narrowing of at least 80% and  haziness suggesting acute vessel.  In addition, the patient also had  densely calcified proximal LAD with diffuse 90% stenoses.  She underwent  stenting of the mid circumflex vessel with ultimate insertion of a 2.75  x 18 mm Cypher stent postdilated to 3.0 mm.  Initial attempts at  angioplasty and cutting balloon atherectomy of the calcified LAD were  not very successful, although suboptimal angioplasty was performed with  moderate improvement in the LAD stenosis.  The patient was subsequently  brought back to the laboratory several days later and underwent complex  intervention with successful high-speed rotational atherectomy and  ultimate stenting of the LAD system on August 21, 2006, with the diffuse  90% stenosis being reduced to 0% following atherectomy insertion of a  3.0 x 33 mm Cypher stent postdilated to 3.5 mm.  At that time, her  circumflex stented site was  widely patent.  The patient had done well  until today when she developed recurrent chest pain at approximately  4:45 p.m..  She presented to [facility] emergency room where she was  found to have ST-segment elevation in leads V4-V6 of approximately 1 to  1.5 mm.  A  Code STEMI was called.  The patient was seen in the  emergency room and ultimately was transferred to the catheterization lab  for acute catheterization.   Upon arrival to the catheterization laboratory, the patient was still  having residual chest pain.  She was started on IV nitroglycerin drip.  Catheterization was done via the left femoral artery, and a 6-French  sheath was inserted; 4 mg IV Valium were administered for conscious  sedation.  Catheterization was done with 6-French Judkins 4 left and  right coronary catheters.  A 6-French pigtail catheter was used for  biplane cine angiography.  With the demonstration of total distal  circumflex occlusion with patent circumflex stent and LAD stent from the  recent procedures, decision was made to attempt acute coronary  intervention.  The distal circumflex was small caliber.  The patient was  given Angiomax bolus and infusion.  Intracoronary nitroglycerin  was  administered.  As well, she was started on IV nitroglycerin drip.  ACT  was documented therapeutic.  Percutaneous coronary intervention was done  utilizing a 6-French FL-4 guide.  A Prowater wire was able to cross the  total occlusion into the distal very small-caliber circumflex system.  The patient was noted be in atrial flutter at this time.  Initial  dilatation at several sites in the distal circumflex were made with a  2.0 x 12 mm Maverick balloon.  A 2.25 x 20 mm Maverick balloon was then  used for inflow dilatations, particularly at the sharp bend in the  vessel distally and also had low level in the most distal, very  diminutive portion of the vessel.  Following establishment of  reperfusion, the patient again  was in atrial flutter, and then it  appeared that she developed a transient VF.  She was immediately shocked  with restoration of cardiac rhythm, and at times she was in and out of  atrial flutter with at times sinus rhythm.  Prior to the intervention,  she also received an additional 300 mg of Plavix and had previously been  on 150 mg daily.  At this time, I felt that at least the more proximal  distal portion of the circumflex should be stented.  The very distal  circumflex was extremely small caliber and not stentable.  A 2.5 x 23 mm  drug-eluting Cypher stent was then passed down the circumflex and placed  in juxtaposition to the distal aspect of the previously placed 2.75  Cypher stent from the procedure 1 month previously.  Dilatation was done  at the most distal aspect up to approximately 12 atmospheres.  The  balloon was then pulled back into the stent, and more aggressive  dilatation was done up to 14 atmospheres.  At this time, there was still  some mild narrowing beyond the stented segment, but again it was felt  that a stent was too large to be placed in this distal aspect.  A 2.75 x  15 mm Quantum balloon was then inserted.  The balloon was advanced to  the most distal aspect of the stent, and 1 atmosphere was inflated with  long inflation up to 2 minutes.  This did significantly improve the site  just distal to the stented segment.  Additional inflation of 1.5  atmospheres was made in this most distal aspect.  The balloon was then  advanced totally within the stented segment, and more aggressive post  stent dilatation was made with tapering ranging from 2.5 mm in the most  distal aspect of the stent to 2.75 mm in the proximal portion of the  stent.  Scout angiography confirmed an excellent angiographic result  with brisk TIMI-3 flow without evidence for dissection.  At the  completion of the procedure, a Foley catheter was also inserted.  The  patient left the catheterization  laboratory with stable hemodynamics and  ultimately seemed to convert out of atrial flutter into sinus rhythm.  She tolerated the procedure well.   HEMODYNAMIC DATA:  Central aortic pressure was 160/66.  Left ventricle  pressure 160/15, post A-wave 24.   ANGIOGRAPHIC DATA:  Left main coronary was angiographically normal and  bifurcated into an LAD and left circumflex system.  The previously  placed stent in the LAD at a site of significant previous calcification  was now widely patent without residual renarrowing.  LAD extended to and  wrapped around the LV apex.   The  circumflex had previously noted ostial 50% narrowing followed by  more proximal 50% narrowing.  The previously placed 2.75 Cypher stent  was widely patent, and this had previously been postdilated at 3.0 mm.  Beyond the stented segment at a portion where the vessel was small and  had a sharp bend, there was 90% stenosis.  Just beyond this segment, the  distal circumflex was very small caliber and then totally occluded.  The  distal branches were not visualized.  There was TIMI-0 flow beyond this.   The right coronary was a moderate-size vessel that had a widely patent  previously placed stent in the proximal to mid segment.  There was 40%  narrowing that was smooth in the region beyond the crux prior to the PDA  takeoff.   Biplane sine ventriculography revealed mild LV dysfunction with ejection  fraction of approximately 45-50% with focal mid inferior  hypocontractility as well as mid anterolateral hypocontractility on the  RAO projection.  On the LAO projection, septal contractility was normal,  but there was moderate hypocontractility involving the posterolateral  wall.   Following coronary intervention with initial PTCA of the distal  circumflex and ultimate placement of a 2.5 x 23 mm Cypher stent in  juxtaposition distal to the previously placed 2.75 Cypher stent, the 90%  and 100% occlusion was reduced to 0%.   The very distal circumflex was  also dilated beyond this segment, and this was reduced to 0%.  The very  distal circumflex was small caliber.   IMPRESSION:  1. Acute lateral wall myocardial infarction secondary to distal      circumflex occlusion.  2. Widely patent stent in the proximal left anterior descending  at      site of prior high-speed rotational atherectomy and stenting August 21, 2006.  3. Tandem ostial and proximal 50% circumflex stenoses with widely      patent mid circumflex stent but evidence for progressive distal      circumflex disease, 90% stenosis in a bend in the vessel distally      followed by total occlusion.  4. Widely patent stent in the proximal to mid right coronary with 40%      narrowing in the distal right coronary artery beyond the crux prior      to the posterior descending artery takeoff.  5. Successful percutaneous transluminal cardiac angioplasty and      stenting of the distal circumflex with 90% and      100% stenoses-occlusions being reduced to 0% done with bivalirudin      and oral Plavix, intracoronary nitroglycerin as well as IV      nitroglycerin.  6. defibrillation x1 following reperfusion.           ______________________________  Nicki Guadalajara, M.D.     TK/MEDQ  D:  09/22/2006  T:  09/24/2006  Job:  [id number]   cc:   Antionette Char, MD  Dr. Selena Batten

## 2010-07-27 NOTE — H&P (Signed)
NAMEBENNY, [name]               ACCOUNT NO.:  [id number]   MEDICAL RECORD NO.:  [id number]          PATIENT TYPE:  INP   LOCATION:  1823                         FACILITY:  [facility]   PHYSICIAN:  Mobolaji B. Bakare, M.D.DATE OF BIRTH:  06/28/1923   DATE OF ADMISSION:  08/14/2006  DATE OF DISCHARGE:                              HISTORY & PHYSICAL   PRIMARY CARE PHYSICIAN:  Dr. Pearson Grippe.   CARDIOLOGIST:  Dr. Aleen Campi.   CHIEF COMPLAINT:  Chest pain.   HISTORY OF PRESENTING COMPLAINT:  [name] is a pleasant 77 year old  Caucasian female.  She was in her usual state of health until about 4  a.m. this morning when she was woken up by left-sided chest pain.  She  rated it as 10/10.  She thought it was going to go away but it did not  and it remained constant and the patient called the office at the  facility where she lives.  She was given two nitroglycerin and EMS was  called.  The chest pain resolved en route to the hospital.  The patient  described chest pain radiating to her back and neck and left forearm  with numbness in her left forearm.  She thinks it is similar to the  chest pain she had when she had myocardial infarction in 2004.  It was  associated with diaphoresis but no nausea, vomiting, no palpitations or  shortness of breath.   The patient denies cough, orthopnea, PND, pedal swelling, or fever.  There is no heartburn.   She had a stress test in Dr. Adelene Idler office last week which was  normal.  She was sent for the stress test for preop evaluation.  She is  planning to have a left shoulder rotator cuff surgery.   REVIEW OF SYSTEMS:  She denies abdominal pain, diarrhea, vomiting, no  fever, dysuria, urgency or increased frequency of micturition.  There is  no headaches or change in her vision.   PAST MEDICAL HISTORY:  1. CAD.      a.     She had myocardial infarction in 2004.  At that time she had       stent placed in right coronary artery in July 2004.  b.     She had unstable angina in August 2005.  She had a repeat       cardiac catheterization at that time and a new lesion was found in       the proximal right coronary artery which required stenting.      c.     Normal stress test May 2008.  2. Hyperlipidemia.  3. Hypertension.  4. Left breast cancer in 2006 status post mastectomy and chemotherapy.  5. History of anemia.  6. History of duodenal ulcer secondary to NSAID and Plavix in October      2004.  7. History of esophageal stricture, Schatzki's ring in October 2004.  8. Hiatal hernia.  9. Type 2 diabetes mellitus diet controlled.  10.Chronic back pain.  She receives epidural injections.  11.History of atrial fibrillation as per patient's report.  This was  noted during a stress test a week ago.  She is currently in normal      sinus rhythm.   PAST SURGICAL HISTORY:  1. Laparoscopic cholecystectomy in October 2004.  2. Left mastectomy in 2006.   CURRENT MEDICATIONS INCLUDE:  1. Aspirin 81 mg daily.  2. Plavix 75 mg daily.  3. Lopressor 25 mg twice a day.  4. Benicar 20 mg daily.  5. Lovastatin 80 mg daily.  6. Iron 150 mg daily.   ALLERGIES:  METFORMIN.   FAMILY HISTORY:  Both parents are deceased.  Father passed away from  complications of TB at the age of 62.  Mother had pneumonia.   SOCIAL HISTORY:  The patient is married, lives with her husband in a  facility.  She is fairly independent of her activities of daily living.  She does not smoke cigarettes or drink alcohol.   PHYSICAL EXAMINATION:  INITIAL VITALS:  Temperature 96.3, blood pressure  161/66, pulse of 62, respiratory rate of 23, O2 saturations of 100%.  GENERAL:  She is comfortable, not in respiratory distress.  HEENT:  Normocephalic, atraumatic head.  Pupils equal, round, and  reactive to light.  Extraocular muscle movement intact.  NECK:  No thyromegaly.  LUNGS:  Clear clinically to auscultation.  CVS:  S1 and S2 regular, no murmur, no gallop.   ABDOMEN:  Obese, soft, nontender.  Bowel sounds present.  EXTREMITIES:  No pedal edema or calf tenderness.  RECTAL EXAMINATION:  No mass found in the rectum.  Stool looks normal.  CNS:  No focal neurological deficits.   INITIAL LABORATORY DATA:  Cardiac markers at the point of care x2 sets  negative.  White cells 5.6, hemoglobin 13.4, hematocrit 38.9, platelets  258, normal differential.  PT/INR 13.7/1.0, PTT 28.  Sodium 140,  potassium 3.6, chloride 104, bicarb 26, glucose 200, BUN 21, creatinine  0.90, bilirubin 1.6, AST 23, ALT 16, albumin 3.3, calcium 9.2.  Chest x-  ray showed no acute abnormality, chronic scarring.  EKG normal sinus  rhythm, no acute ST changes.   ASSESSMENT AND PLAN:  Problem 1. CHEST PAIN.  This sounds typical.  The  patient has significant history of coronary artery disease.  She has had  stent placement twice in the right coronary artery and has other  stenotic lesions as per cardiac catheterization in 2005.  Given the  nature of her symptoms it sounds cardiac.  The patient will be admitted  to telemetry floor and cycle cardiac enzymes.  Obtain 2-D echocardiogram  to evaluate for wall motion abnormality.  We will start full dose  anticoagulation until she rules out.  We will continue aspirin, Plavix,  Lopressor, and Benicar.  Cardiology has been consulted.   The patient had a negative stress test a week ago.  I will rule out  other less likely etiology, for example, aortic dissection and pulmonary  embolism.  We will obtain a CT angiogram of the chest.  The patient has  a good renal function.  She has not been on a proton pump inhibitor.  We  will start on Protonix.  We will check fasting lipid profile and  hemoglobin A1c.   Problem 2. DIABETES MELLITUS TYPE 2/DIET CONTROLLED.  Check hemoglobin  A1c and continue modified carb-modified diet.   Problem 3. HYPERTENSION.  This appears uncontrolled at this point.  The  patient has not used her medications  today.  We will resume both  Lopressor and Benicar.   Problem 4. STATUS POST  LEFT MASTECTOMY FOR LEFT BREAST CANCER IN 2006.   Problem 5. HISTORY OF CORONARY ARTERY DISEASE.   Problem 6. RECENT DIAGNOSIS OF ATRIAL FIBRILLATION PER PATIENT'S REPORT.  The patient is currently in normal sinus rhythm.  We will monitor on  telemetry.      Mobolaji B. Corky Downs, M.D.  Electronically Signed     MBB/MEDQ  D:  08/14/2006  T:  08/14/2006  Job:  [id number]   cc:   Antionette Char, MD  Dr. Pearson Grippe, [facility]  Claude Manges. Cleophas Dunker, M.D.

## 2010-07-27 NOTE — Consult Note (Signed)
[name], [name] NO.:  [id number]   MEDICAL RECORD NO.:  [id number]          PATIENT TYPE:  INP   LOCATION:  2915                         FACILITY:  [facility]   PHYSICIAN:  Valetta Fuller, M.D.  DATE OF BIRTH:  March 27, 1923   DATE OF CONSULTATION:  08/16/2006  DATE OF DISCHARGE:                                 CONSULTATION   REASON FOR CONSULTATION:  Asymptomatic left hydronephrosis.   HISTORY OF PRESENT ILLNESS:  [name] is a very pleasant, 77 year old  female with no real urologic history other than some very occasional  sporadic episodes of cystitis.  The patient was recently admitted to  [facility] because of ongoing issues with chest pain.  She was  admitted approximately 2 days ago.  The patient does have a coronary  artery history.  She woke up with some left-sided chest pain.  Nitroglycerin did seem to initially relieve her discomfort.  The patient  had previous history of MI and was admitted because of her chest pain  for further evaluation and treatment.  As part of that, she did have a  limited chest CT which did show the upper aspect of the left kidney and  this appeared to be hydronephrotic.  The patient subsequently had a  renal ultrasound which confirmed the presence of moderate left  hydronephrosis.  The etiology and level of the obstruction could not be  determined.  The patient had no signs or symptoms of urologic problems.  She reports totally normal voiding patterns, no gross hematuria, no  flank or abdominal discomfort.  Her left-sided chest pain was definitely  in her chest and not within her flank region.  She has no personal or  family history of stone disease.  She does have a history of breast  cancer.  She apparently has a good prognosis from that disease, although  the diagnosis was only established about 2 years ago and she underwent  lumpectomy with some chemotherapy.   PAST MEDICAL HISTORY:  1. Coronary artery  disease with subsequent cardiac catheterization and      had intervention which was reviewed in the chart.  The patient      seems to be doing better and is relatively stable.  2. Hyperlipidemia.  3. History of left breast cancer.  4. History of type 2 diabetes mellitus with the diet control.  5. History of intermittent atrial fibrillation.   CURRENT MEDICATIONS:  Metoprolol, Plavix, Avapro, Zocor, Zetia, digoxin.   ALLERGIES:  METFORMIN.   FAMILY HISTORY:  Otherwise unremarkable.   REVIEW OF SYSTEMS:  Essentially negative at this time.   PHYSICAL EXAMINATION:  GENERAL:  She is a well-developed, well-nourished  female.  She is resting very quietly in no acute distress.  She is  awake, alert and oriented x3.  ABDOMEN:  Her abdomen at this time shows no obvious CVA tenderness.  No  palpable masses.  The patient does have an indwelling catheter for fluid  monitoring.   ASSESSMENT:  Asymptomatic left hydronephrosis.  At this point, the  significance and etiology of her hydronephrosis is yet to  be  established.  Obviously, this could be essentially no problem and merely  represent a more extrarenal pelvis, although there was some caliceal  dilation, so she probably does have a least partial obstruction.  We  need to find the level of the obstruction and also the cause and whether  it is intrinsic, i.e. ureteral tumor or stone versus extrinsic such as  extrinsic scarring or possibly recurrent breast cancer.   RECOMMENDATIONS:  The most appropriate initial test would be CT of the  abdomen and pelvis with intravenous contrast.  She has had quite a few  contrast loads in the last 48 hours and so it may be best to make sure  that she is adequately hydrated and potentially wait a day or so before  obtaining the CT.  If the patient is stable and ready for discharge, the  hydronephrosis can certainly be evaluated on an outpatient basis since  her overall systemic renal function is completely  normal and she is  completely asymptomatic.  Of note, her creatinine is 0.9.  We will  follow her in the hospital if she does have the imaging, otherwise  arrange for outpatient followup and evaluation.           ______________________________  Valetta Fuller, M.D.  Electronically Signed     DSG/MEDQ  D:  08/17/2006  T:  08/17/2006  Job:  [id number]

## 2010-07-27 NOTE — H&P (Signed)
[name], [name] NO.:  [id number]   MEDICAL RECORD NO.:  [id number]          PATIENT TYPE:  INP   LOCATION:  1828                         FACILITY:  [facility]   PHYSICIAN:  Elliot Cousin, M.D.    DATE OF BIRTH:  [birth year]   DATE OF ADMISSION:  10/14/2007  DATE OF DISCHARGE:                              HISTORY & PHYSICAL   PRIMARY CARE PHYSICIAN:  Pearson Grippe, MD.   PRIMARY CARDIOLOGIST:  Nicki Guadalajara, MD.   PRIMARY GASTROENTEROLOGIST:  Sabino Gasser, MD.   CHIEF COMPLAINT:  Abdominal pain, nausea, vomiting and diarrhea.   HISTORY OF PRESENT ILLNESS:  The patient is an 77 year old woman with a  past medical history significant for coronary artery disease, paroxysmal  atrial fibrillation, type 2 diabetes mellitus, and Barrett's  esophagitis.  She presents to the emergency department with a 2-day  history of abdominal pain, nausea, vomiting, and diarrhea.  The  patient's symptoms started with diarrhea early yesterday.  Throughout  the whole day and night yesterday, she had a total of approximately 12-  15 stools.  Her stools are loose and sometimes watery.  She has seen no  bright red blood or black tarry stools.  She has had dull crampy,  intermittent abdominal pain for the past 2 days; however, over the past  12 hours, the pain has been continuous.  She rates the pain as 10/10  when it hits me.  However, for the most part, she rates the pain as a  6-7/10 in intensity.  She had one episode of nausea and vomiting today.  There was no evidence of bright red blood or coffee ground emesis.  She  has had no associated chest pain, indigestion, shortness of breath or  heartburn.  She did have transient subjective fever and chills early  this morning.  She denies any upper respiratory infection symptoms.  She  denies painful urination.  She did have some left over pills for  diarrhea in her medicine cabinet and she took 2 earlier today prior to  her presentation to the  emergency department.  So far today, she has had  approximately 4 or 5 loose stools.  She denies any known sick contacts.  She lives at the [facility], which is an independent living and  assisted living facility.  She denies any recent travel or any recent  treatment with antibiotics.  She denies eating out at [employer]  lately.   During the evaluation in the emergency department, the patient is noted  to be hemodynamically stable and afebrile.  Her acute abdominal series  reveals no acute abnormalities, unremarkable bowel gas pattern, and  cardiomegaly without evidence of acute cardiopulmonary disease.  Her lab  data are significant for a total bilirubin of 2.6, SGOT of 164, and an  SGPT of 74.  The patient will be admitted for further evaluation and  management.   PAST MEDICAL HISTORY:  1. Acute myocardial infarction in July of 2008 with a previous history      of coronary artery disease.  2. Status post Cypher stenting x2 to the circumflex  artery by Dr.      Nicki Guadalajara in July of 2008.  3. Status post previous coronary artery stenting in July of 2004 and      August of 2005.  4. Paroxysmal atrial fibrillation, on chronic Coumadin therapy.  5. Hypothyroidism.  6. Hypertension.  7. Type 2 diabetes mellitus.  8. Hyperlipidemia.  9. Barrett's esophagitis per EGD by Dr. Sabino Gasser in December of      2008.  10.Internal hemorrhoids and cecal polyp per colonoscopy by Dr. Virginia Rochester in      December of 2008.  11.History of duodenal ulcer/GI bleed, NSAID-induced, in October of      2004.  12.Status post dilatation of esophageal stricture in October of 2004      by Dr. Leone Payor.  13.History of hiatal hernia.  14.Status post laparoscopic cholecystectomy in October of 2004.  15.History of E-coli urinary tract infection.  16.Status post left breast lumpectomy and treatment with chemotherapy      for left-sided breast cancer in April of 2006.   MEDICATIONS:  1. Coumadin 5 mg, take  1 tablet every Monday and Friday and 1/2 tablet      the other days.  2. Aspirin 81 mg 1/2 tablet daily.  3. Plavix 75 mg daily.  4. Vitamin D 1000 international units once daily.  5. Benicar 40 mg daily.  6. Diltiazem 240 mg daily.  7. Januvia 100 mg daily.  8. Levothroid 0.15 mg daily.  9. Isosorbide mononitrate 30 mg daily.  10.Simvastatin, question dose, nightly.   ALLERGIES:  THE PATIENT HAS AN INTOLERANCE/ALLERGY TO METFORMIN WHICH  CAUSES DIFFUSE DIARRHEA.   SOCIAL HISTORY:  The patient is married and has been for 62 years.  She  lives with her husband at the independent section of [address] [place]  She is fairly independent with her ADLs.  She has 2 children.  She denies tobacco, alcohol and illicit drug use.  She still drives.  She is retired.   FAMILY HISTORY:  Both of her parents are deceased.  Her father died of  complications of TB at 25 years of age.  Her mother died of pneumonia at  62 years of age.   REVIEW OF SYSTEMS:  As above in history present illness.   EXAM:  Temperature 97.7, blood pressure 149/84, pulse 91, respiratory  rate 22, oxygen saturation 95% on room air.  GENERAL:  The patient is a pleasant, mildly obese elderly Caucasian  woman who is currently lying in bed in no acute distress at this time.  HEENT:  Head is normocephalic, nontraumatic.  Pupils equal, round, react  to light.  Extraocular muscles are intact.  Conjunctivae are clear.  Sclerae are white.  Tympanic membranes not examined.  Nasal mucosa is  dry, no sinus tenderness.  Oropharynx reveals mildly dry mucous  membranes.  No posterior exudates or erythema.  Teeth are in good  repair.  NECK:  Supple, obese, no adenopathy, no thyromegaly, no bruit, no JVD.  LUNGS:  Clear anteriorly and with decreased breath sounds in the bases.  HEART:  Irregular irregular with a mild systolic murmur.  ABDOMEN:  Obese.  Several small well-healed epigastric and right upper   quadrant scars noted.  Positive bowel sounds, soft, mildly and diffusely  tender.  No distention, no masses, and no hepatosplenomegaly.  EXTREMITIES:  Pedal pulses are palpable bilaterally.  No pretibial edema  and no pedal edema.  NEUROLOGIC:  The patient is alert and oriented  x3.  Cranial nerves II-  XII are intact.  Strength is 5/5 throughout.  Sensation is intact.  RECTAL/GU:  Deferred.   ADMISSION LABORATORIES:  Urinalysis large blood, positive nitrite,  moderate leukocytes, PT 29.3, INR 2.6, lipase 94.  Sodium 139, potassium  4.2, chloride 107, CO2 23, glucose 145, BUN 14, creatinine 0.89, total  bilirubin 2.6, alkaline phosphatase 92, SGOT 164, SGPT 74, total protein  6.7, albumin 3.4, calcium 9.0.  WBC 9.0, hemoglobin 15.7, platelets 229.   ASSESSMENT:  1. Abdominal pain, nausea, vomiting, and diarrhea.  The etiology is      unclear; however, an acute gastroenteritis, acute pancreatitis,      acute gastritis, and acute cystitis are possibilities.  2. Elevated lipase.  The elevation is suggestive of acute      pancreatitis; however, it is not clear that her symptoms are the      manifestation of acute pancreatitis.  3. Elevated liver function tests/hepatic transaminitis.  The patient      has a history of a laparoscopic cholecystectomy dating back to      2004.  It is certainly possible that the patient may have a      retained stone.  An ultrasound of the abdomen has been ordered,      however, the results are pending..  Of note, the patient is      chronically treated with simvastatin by history and the simvastatin      may be contributing to the elevated liver function tests.  4. Pyuria and hematuria.  The patient gives no history of painful      urination; however, her symptomatology may be in part due to acute      cystitis or pyelonephritis.  5. Chronically anticoagulated with Coumadin secondary to paroxysmal      atrial fibrillation.  The patient's INR is currently  therapeutic.  6. Type 2 diabetes mellitus.  The patient's capillary blood glucose is      145 currently.  7. Coronary artery disease with a history of multiple angioplasties      and stents in the past.  The patient gives no recent history of      chest pain.   PLAN:  1. The patient will be admitted for further evaluation and management.  2. An ultrasound of the abdomen was ordered by the emergency      department physician and the results are pending.  If the      ultrasound is unremarkable, we will consider further evaluation      with a CT scan of the abdomen or an MRCP, if clinically indicated.  3. Will hold the simvastatin.  4. Will check stool studies.  5. Will start Rocephin.  6. Will start gentle IV fluids and a clear liquid diet only.  7. Supportive care with morphine for pain and Zofran or Phenergan as      needed for nausea.  8. Will hold some of the patient's oral medications for at least 24      hours.  9. Will consult gastroenterologist, Dr. Virginia Rochester, only if clinically      indicated.      Elliot Cousin, M.D.  Electronically Signed     DF/MEDQ  D:  10/14/2007  T:  10/14/2007  Job:  [id number]   cc:   Massie Maroon, MD  Nicki Guadalajara, M.D.  Georgiana Spinner, M.D.

## 2010-07-27 NOTE — Cardiovascular Report (Signed)
NAMERICHARD, HOLZ NO.:  [id number]   MEDICAL RECORD NO.:  [id number]          PATIENT TYPE:  INP   LOCATION:  3703                         FACILITY:  [facility]   PHYSICIAN:  Antionette Char, MD    DATE OF BIRTH:  [birth year]   DATE OF PROCEDURE:  08/14/2006  DATE OF DISCHARGE:                            CARDIAC CATHETERIZATION   PROCEDURES.:  1. Left heart catheterization  2. Coronary cineangiography.  3. Left internal mammary artery cineangiography.  4. Left ventricular angiography.   INDICATIONS FOR PROCEDURE:  This 77 year old female has a history of  single-vessel coronary artery disease and is status post coronary artery  angioplasty in 2004 in her proximal right coronary artery with a bare  metal stent.  This was followed by an unstable event in 2005 with the  findings of stenosis proximal to the stent at which time a second stent  was placed proximal to the first stent. She had been stable until her  present illness when she again had the onset of resting chest pain and  was admitted yesterday for evaluation. Her first set of enzymes were  normal, but this was followed by a third and fourth set of cardiac  enzymes which showed a mild elevation in her troponin I of 0.40 and  0.24. With the mild elevation and her past history of coronary artery  disease, she was scheduled for diagnostic catheterization.   PROCEDURE:  After signing an informed consent, the patient was  premedicated with 5 mg of Valium by mouth and brought to the cardiac  catheterization lab at [facility]. Her right groin was prepped  and draped in a sterile fashion and anesthetized locally with 1%  lidocaine. A 6-French introducer sheath was inserted percutaneously into  the right femoral artery.  The 6-French #4 Judkins coronary catheters  were used to make injections into the native coronary arteries.  The  right coronary catheter was used to make a midstream injection into  the  left subclavian artery visualizing the left internal mammary artery. A 6-  French pigtail catheter was used to measure pressures in the left  ventricle and aorta and to make a midstream injection into the left  ventricle.  The patient tolerated the procedure well, and no  complications were noted. At the end of the procedure after noting new  significant stenotic lesions in her left coronary artery, we reviewed  her cineangiograms with Dr. Daphene Jaeger and felt that the new lesion in  her proximal circumflex was the culprit lesion and the cause of her  present illness. We discussed this with the patient,  recommending  proceeding with a stent placement in her circumflex lesion.  The patient  was left in place on the table for Dr. Tresa Endo to proceed with the  angioplasty.  The sheath was left in place for the procedure.   CORONARY CINEANGIOGRAPHY FINDINGS:  Left coronary artery:  The ostium  and left main appeared normal.   The left anterior descending: The proximal LAD has a segmental stenotic  lesion that is between 60% and 70%. This is an  irregular plaque with  mild calcification and has fairly good antegrade flow with good distal  runoff. The lesion involves the takeoff of two medium size anterolateral  branches and the first septal branch.  The mid and distal LAD appear  normal.   Circumflex coronary artery:  The circumflex is normal in the proximal  segment.  The middle segment has a bifurcation with the larger branch  becoming a distal obtuse marginal branch. There is a segmental plaque  with a focal lesion of 80%. There is fairly good antegrade flow.   Right coronary artery:  The ostium appears normal.  The proximal two  stents are now normal in appearance without significant restenosis, and  there is very good appearance of the mid and distal right coronary  system.   Left internal mammary artery:  Appears normal.   Left ventricular cine angiogram:  The left ventricular  chamber size,  contractility, and wall thickness appear normal.  The ejection fraction  was estimated at 70%. The mitral and aortic valves appear normal   FINAL DIAGNOSES:  1. Coronary artery disease.  2. Very good long-term appearance of the two proximal stents in the      right coronary artery.  3. Progression of disease in her left coronary artery with 80%      stenosis in her mid circumflex and 60-70% stenosis in her proximal      left anterior descending.  4. Normal left internal mammary artery.  5. Normal left ventricular function.   DISPOSITION:  The patient was left on the catheterization table with the  sheath in place for Dr. Tresa Endo to proceed with an angioplasty procedure.      Antionette Char, MD  Electronically Signed     JRT/MEDQ  D:  08/15/2006  T:  08/15/2006  Job:  [id number]   cc:   Coronary Catheterization Lab, [facility]

## 2010-07-27 NOTE — Discharge Summary (Signed)
Rebecca, [name] NO.:  [id number]   MEDICAL RECORD NO.:  [id number]          PATIENT TYPE:  INP   LOCATION:  2915                         FACILITY:  [facility]   PHYSICIAN:  Isidor Holts, M.D.  DATE OF BIRTH:  Apr 22, 1923   DATE OF ADMISSION:  08/14/2006  DATE OF DISCHARGE:  08/23/2006                               DISCHARGE SUMMARY   PRIMARY MEDICAL DOCTOR:  Dr. Pearson Grippe.   PRIMARY CARDIOLOGIST:  Dr. Aleen Campi.   DISCHARGE DIAGNOSES:  1. Coronary artery disease.  2. Type 2 diabetes mellitus.  3. Dyslipidemia.  4. Hypertension.  5. Paroxysmal atrial fibrillation.  6. Asymptomatic left hydronephrosis.   DISCHARGE MEDICATIONS:  1. Plavix 75 mg p.o. daily.  2. Levothyroxine 150 mcg p.o. daily.  3. Lovastatin 80 mg p.o. daily.  4. Actos 15 mg p.o. daily.  5. Zetia 10 mg p.o. daily.  6. Prevacid 30 mg p.o. daily.  7. Enteric-coated aspirin 325 mg p.o. daily (was on 81 mg p.o. daily).  8. Cardizem CD 180 mg p.o. daily.  9. Lopressor 50 mg p.o. b.i.d. (was on metoprolol 25 mg p.o. daily).  10.Nu-Iron 150 mg p.o. b.i.d.  11.Benicar 20 mg p.o. daily.  12.Multivitamin 1 p.o. daily.   PROCEDURES:  1. Portable chest x-ray data August 14, 2006:  This showed chronic      scarring of the left base; no active disease.  2. Chest CT angiogram dated August 14, 2006:  This showed no CT scan      evidence for pulmonary emboli; there was patchy water density      especially within the lower lobes and slight patchy water density      changes within the upper lobes.  Left hydronephrosis was noted.  3. Renal ultrasound scan dated August 15, 2006:  This showed moderate      left hydronephrosis; question mild fullness of the right renal      pelvis; normal renal size for patient's age.  4. Pelvic/abdominal CT scan dated August 17, 2006:  This showed right      groin hematoma with proximal and distal superficial expansion; no      evidence of intraperitoneal hemorrhage.  Distally, the  ureters are      decompressed, as is the bladder.  There was bilateral renal      pelvocaliectasis versus parapelvic cysts.  The ureters are not      dilated.  There is no retroperitoneal mass or hemorrhage.  5. A portable chest x-ray dated August 16, 2006:  This showed left      subclavian syndrome; venous catheter in good position with the tip      in the distal SVC; no pneumothorax; there was also streaky      bibasilar atelectasis.  6. A 2-D echocardiogram dated August 15, 2006:  This showed overall left      ventricular systolic function at lower limits of normal; LVEF 50%;      no diagnostic left ventricular regional wall motion abnormality;      left ventricular wall thickness was at upper limits of normal;  Doppler parameters were consistent with high left ventricular      filling pressure.  Aortic valve thickness was mildly increased.      Estimated peak right ventricular systolic pressure was mildly      increased.  7. Cardiac catheterization dated August 14, 2006 by Dr. Charolette Child:      This showed coronary artery disease.  There was good long-term      appearance of the two proximal stents in the right coronary artery;      progression of disease in left coronary artery with 80% stenosis in      mid circumflex and 60-70% stenosis in proximal left anterior      descending; normal left internal mammary artery; normal left      ventricular function.  8. Cardiac catheterization dated August 15, 2006 by Dr. Nicki Guadalajara:      Successful percutaneous transluminal coronary angioplasty/stent of      diffuse 80% left circumflex mid stenosis with insertion of a 2.75 x      18 mm Cypher stent post dilated 3 mm.  Also, percutaneous      transluminal coronary angioplasty with diffusely diseased left      anterior descending with calcification and vessel tortuosity with      90% proximal stenosis followed by 50% and 80% narrowing with      angiographic percutaneous transluminal coronary  angioplasty results      of approximately 60% with resolution of haziness and ST-segment      changes.  9. Repeat cardiac catheterization August 21, 2006:  Successful complex      high-speed rotational atherectomy involving the proximal to      proximal mid segment with diffusely calcified left anterior      descending artery with utilization of 1.75 mm bur and ultimate      insertion of a 3 x 33 mm drug-eluting Cypher stent post dilated to      3.43 mm.  The procedure was performed by Dr. Nicki Guadalajara.   CONSULTATIONS:  1. Dr. Aleen Campi, cardiologist.  2. Dr. Nicki Guadalajara, cardiologist.  3. Dr. Isabel Caprice, urologist.   ADMISSION HISTORY:  As in H&P notes of August 14, 2006, dictated by Dr.  Jamison Oka.  However, in brief, this is an 77 year old female, with  rather complex cardiac history consisting of myocardial infarction in  2004 status post stent right coronary artery July 2004, followed by  unstable angina August 2005 with repeat cardiac catheterization and  stenting of new lesion.  She did have a normal stress test in May 2008,  also dyslipidemia, hypertension, a remote history of duodenal ulcer  secondary to NSAIDs and Plavix October 2004, a history of Schatzki's  ring, hiatal hernia, type 2 diabetes mellitus, chronic back pain, a  history of left breast cancer 2006 status post mastectomy and  chemotherapy, a history of chronic anemia, and paroxysmal atrial  fibrillation.  The patient now presents with persistent left-sided chest  pain.  She was admitted for further evaluation, investigation, and  management.   CLINICAL COURSE:  1. Chest pain.  The patient has a rather complex known cardiac history      of ischemic heart disease, status post prior MI and percutaneous      interventions.  She now presents with persistent chest pain.      Initial cardiac enzymes were negative.  Cardiology consultation was     called, and was kindly provided by Dr. Aleen Campi, who performed the  coronary angiogram on August 14, 2006.  For details, refer to      procedure list above.  However, the patient was found to have      significant coronary artery disease necessitating percutaneous      intervention on August 15, 2006 by Dr. Nicki Guadalajara.  The patient had      stenting at that time.  This was followed on August 21, 2006 by repeat      cardiac catheterization and rotational atherectomy of culprit      vessels with good results.  Again, for details of that procedure,      refer to procedure list above.  Following these procedures, the      patient has remained asymptomatic and she is expected to follow up      on an outpatient basis with Dr. Aleen Campi.   1. Paroxysmal atrial fibrillation.  The patient has a known history of      paroxysmal atrial fibrillation.  She remained in sinus rhythm for      the most part of her hospital stay.  Initially, she had been      commenced on anticoagulation with Heparin.  However, the patient      post cardiac catheterization, became anemic with hemoglobin of 7.9      necessitating blood transfusion.  Anticoagulation has been      discontinued.  Also, the patient does have a remote history of      previous peptic ulcer disease.  It is clear that at the present      time, she is not deemed the ideal candidate for anticoagulation.   1. Anemia.  The patient has a history of chronic anemia, likely anemia      of chronic disease.  At the time of presentation, her hemoglobin      was 13.4.  However, on August 22, 2006, she was noted to have a      hemoglobin level of 7.9; this is deemed secondary to acute blood      loss, following a mild groin hematoma following cardiac      catheterization.  She was transfused with 2 units of PRBCs,      resulting in a satisfactory bump of hemoglobin levels to 9.8 on      August 23, 2006.  The patient has also been commenced on Iron      supplements.   1. Type 2 diabetes mellitus.  This was managed with a combination of       carbohydrate-modified diet, sliding scale insulin coverage, and      Actos during the course of the patient's hospitalization, and she      remained euglycemic on this regimen.   1. Hypertension.  This was controlled with a combination of ARB and      beta blocker.  Of note, the patient is on Cardizem CD for      paroxysmal atrial fibrillation, and this of course will provide      good antihypertensive effect.   1. Dyslipidemia.  The patient continues on a combination of Statin and      Zetia.   1. Hypothyroidism.  The patient is on replacement therapy with      levothyroxine 150 mcg p.o. daily. we shall defer followup of her      thyroid function tests to her primary M.D, and recommend repeat      thyroid function tests within 6-8 weeks.   1. Asymptomatic left hydronephrosis.  The patient had no symptoms      referable to the urinary tract.  However, a chest CT angiogram was     done on August 14, 2006 to evaluate for possible pulmonary embolism.      This showed no CT scan evidence of PE, but it did show left      hydronephrosis.  This was followed up by renal ultrasound scan on      August 15, 2006 which essentially confirmed the finding.  Urology      consultation was kindly provided by Dr. Isabel Caprice who recommended      obtaining a pelvic/abdominal CT scan.  For details of findings,      refer to procedure list above.  However, there was bilateral renal      pelvocaliectasis versus parapelvic cysts; ureters were not dilated,      and there was no peritoneal mass or hemorrhage.  Dr. Isabel Caprice has      recommended outpatient followup, which the patient has been      encouraged to attend.   DISPOSITION:  The patient was considered sufficiently clinically  recovered and stable to be discharged on August 23, 2006.   DIET:  Heart-healthy/carbohydrate-modified.   ACTIVITY:  As tolerated.   RECOMMENDATIONS:  Recommended to increase activity slowly, otherwise per  cardiac rehab/PT/OT.    FOLLOWUP INSTRUCTIONS:  The patient is recommended to follow up with Dr.  Aleen Campi, primary cardiologist, within 2 weeks.  This has been discussed  on August 23, 2006 with Dr. Aleen Campi.  The patient to call for an  appointment.  Also, the patient is to follow up with Dr. Isabel Caprice within 2  weeks, i.e. Urology.  Appropriate information has been supplied.      Isidor Holts, M.D.  Electronically Signed     CO/MEDQ  D:  08/23/2006  T:  08/23/2006  Job:  [id number]   cc:   Antionette Char, MD  Valetta Fuller, M.D.

## 2010-07-27 NOTE — Cardiovascular Report (Signed)
[name], [name] NO.:  [id number]   MEDICAL RECORD NO.:  [id number]          PATIENT TYPE:  INP   LOCATION:  2915                         FACILITY:  [facility]   PHYSICIAN:  Nicki Guadalajara, M.D.     DATE OF BIRTH:  1923-09-19   DATE OF PROCEDURE:  DATE OF DISCHARGE:                            CARDIAC CATHETERIZATION   INDICATIONS:  [name] is an 77 year old patient of Dr. Charolette Child.  She is status post remote stenting to her right coronary  artery in July 2004 and had a new site in August 2005.  She recently was  admitted with acute coronary syndrome.  Ultimately, had positive  troponin.  On August 15, 2006, diagnostic catheterization was done by Dr.  Aleen Campi.  Please refer to his catheterization note.  Subsequent to his  diagnostic study, I did perform coronary intervention to high-grade left  circumflex stenosis with PTCA and ultimate insertion of a 2.75 x 18-mm  drug-eluting Cypher stent postdilated to 3.0 mm.  At the time, the  patient also had significant concomitant high-grade calcified LAD  disease.  Since she still had ST-segment changes and chest pain  following the circumflex intervention, an initial attempt at that time  was made to open up the LAD system.  However, due to the marked vessel  tortuosity and significant calcification.  PTCA was only able to be  performed, and the lesion was not amendable to accepting a cutting  balloon or stent.  Due to dye load considerations and the patient's  frailty, she ultimately left the catheterization laboratory with stable  hemodynamics with resolution of chest pain following PTCA, but with a  suboptimal long-term result.  For this reason, the patient was hydrated  and is now ultimately brought back for attempt at high-speed rotational  atherectomy of her diffusely diseased and calcified LAD system.   PROCEDURE:  After premedication with Valium intravenously, the patient  was prepped and draped in  usual fashion.  Her left femoral artery was  punctured anteriorly and a 7-French sheath was inserted for the  prospective high-speed rotational atherectomy procedure.  Due to the  high likelihood of significant bradycardia which was also demonstrated  during the diagnostic procedure, a prophylactic temporary pacemaker was  inserted via the left femoral vein.  A 5-French pacemaker was advanced  to the RV apex with documentation of excellent capture.  The coronary  intervention was done with the patient already on Plavix and received  2V3A inhibition with Integrilin in double bolus fashion.  Weight  adjusted heparinization was administered.  A 7-French Q-curve guide was  used for the interventional procedure.  A Roto extra support wire was  able to be advanced down the LAD system with some difficulty due to the  significant calcification and some vessel tortuosity.  Initially, a 1.5  mm bur was used for high-speed rotational atherectomy with several runs  made up to 170,000 revolutions per minute.  The patient was pacemaker  dependent during all of the runs with the rotational atherectomy.  A  1.75 mm bur was then inserted and several additional  runs were made with  significant debulking of the very proximal and proximal lesions.  Again,  the patient was pacemaker dependent over the rotational atherectomy.  She received numerous doses of intracoronary nitroglycerin and also was  on IV nitroglycerin.  A 3.0 x 20-mm Maverick balloon was done following  the rotational atherectomy.  This resulted in smoothing out the area  possibly even an further, and the entire segment that had undergone  rotational atherectomy had low-level inflation with the 3-0 balloon.  The Asahi medium wire was then inserted via the over the wire balloon  after the Roto extra support wire was removed.  A 3.0 x 33-mm Cypher  stent was unable to now be successfully advanced beyond the proximal LAD  to cover the entire 30-32  mm of disease segment.  This was dilated x2 to  16 atmospheres.  A 3.5 x 20-mm Quantum balloon was used for post stent  dilatation to a maximal size of 3.43 mm.  [facility] angiography confirmed an  excellent angiographic result with straightening out of the significant  proximal vessel tortuosity, significant debulking of prior calcification  with the residual stenosis of 0% with brisk TIMI III flow.  The patient  tolerated the procedure well.  Was evidence for small hematoma at the  venous site in the femoral vein region following completion of the  study.   HEMODYNAMIC DATA:  The central aortic pressure was 153/90.   ANGIOGRAPHIC DATA:  At the start of the procedure, initial injection  showed normal left main coronary artery which bifurcated into an LAD and  left circumflex system.  The stent which had been previously placed in  the circumflex vessel was widely patent with no restenosis.  The LAD was  improved following the initial angioplasty from August 15, 2006, but there  was still significant vessel tortuosity with narrowing of 70-80%,  particularly proximally in the sharp end of the calcified segment.  This  was the site on June 3.  A 2.0 x 6 mm AtheroCath was not able to cross  nor was attempt at placing a stent.  Following successful high-speed  rotational atherectomy with significant debulking of calcification  utilizing a 1.5 mm bur and a 1.75 mm bur with ultimate insertion of a  3.0 x 33-mm Cypher stent postdilated to 3.43 mm.  At the start of the  procedure, there was diffuse narrowing of 70-80% proximally followed by  50-60% narrowing in the proximal segment.  Following insertion of the  stent, this entire segment of 33 mm was reduced to 0%.  There is brisk  TIMI III flow and no chest pain or ECG changes.   IMPRESSION:  Successful complex high-speed rotational atherectomy  involving the proximal to proximal mid segment of a diffusely calcified left anterior descending artery  with utilization of 1.5, 1.75 mm bur,  and ultimate insertion of a 3.0 x 33-mm drug-eluting Cypher stent  postdilated to 3.43 mm and with double bolus Integrilin/weight adjusted  heparinization/Plavix as well as IV intracoronary nitroglycerin.           ______________________________  Nicki Guadalajara, M.D.     TK/MEDQ  D:  08/22/2006  T:  08/23/2006  Job:  [id number]   cc:   Rebecca Char, MD  Pearson Grippe, M.D.

## 2010-07-27 NOTE — Cardiovascular Report (Signed)
NAMEJEMA, DEEGAN NO.:  [id number]   MEDICAL RECORD NO.:  [id number]          PATIENT TYPE:  INP   LOCATION:  2915                         FACILITY:  [facility]   PHYSICIAN:  Nicki Guadalajara, M.D.     DATE OF BIRTH:  1923/05/05   DATE OF PROCEDURE:  08/15/2006  DATE OF DISCHARGE:                            CARDIAC CATHETERIZATION   INDICATIONS:  Ms. [name] is an 77 year old patient of Dr.  Aleen Campi.  The patient is status post prior stenting in her right  coronary artery in July 2004 and a new site in August 2005.  She has a  history of hypertension, hyperlipidemia, remote history of duodenal  ulcer, type 2 diabetes mellitus.  Diagnostic cardiac catheterization was  done by Dr. Aleen Campi today.  Please refer to his cardiac catheterization  report.  She had presented with symptoms compatible with coronary  syndrome.  Troponin was positive at 0.07.  Following the diagnostic  cardiac catheterization Dr. Aleen Campi asked me to perform coronary  intervention.   DESCRIPTION OF PROCEDURE:  The femoral artery sheath was in place from  the diagnostic procedure done by Dr. Aleen Campi.  Bivalirudin was  administered for anticoagulation.  The patient was on Plavix and  received an additional 150 mg oral loading at the start of the  procedure.  Attention was directed at the left circumflex coronary  artery although the patient did have significant proximal LAD disease as  well but it was felt after cine review from the diagnostic study done by  Dr. Aleen Campi that most likely the circumflex lesion was responsible for  her syndrome.  At the start of the intervention, there was mild S-T  segment elevation noted on the telemetry and she was having arm  discomfort.  Ultimately, an FL-4 6-French guide was used for the  interventional procedure.  An ATW marker wire was advanced down the  circumflex vessel to help with lesion sizing due to the length of the  lesion.  Predilatation was  done utilizing a 2.5 x 15-mm Maverick  balloon.  A 2.75 x 18-mm drug-eluting Cypher stent was then successfully  deployed, dilated x2 to 15 atmospheres.  Poststent dilatation was done  utilizing a 3.0 x 15-mm Quantum balloon.  [facility] angiography confirmed an  excellent angiographic result with the diffuse 80% stenosis now being  reduced to 0% with brisk TIMI III flow and no evidence of haziness.  However, since the ST-segment still had mild elevation an additional  view was done of the proximal LAD due to the complexity and diffuseness  of the LAD lesions.  On the RAO cranial view, it demonstrated that in  this view the LAD lesion proximally and a very calcified tortuous  segment was proximally 90% with the mild haziness.  This was very  tortuous and calcified and following this 90% stenosis was a 50%  stenosis after a small diagonal vessel proximal to the septal  perforating artery and after a second proximal diagonal vessel.  There  was diffuse narrowing of 80%.  At this time, the patient still had some  residual arm discomfort and  since she still had some mild ST elevation  it was felt perhaps that this high-grade LAD lesion may be also  responsible for her current symptomatology.  Since the guiding catheter  was already in the left coronary system and the wire was still in the  circumflex, discussion was made with the patient concerning bringing her  back another day for intervention to the LAD system versus due to still  residual discomfort perhaps attempting to improve flow to her LAD system  presently.  She was given additional sedation with Valium after  consenting to attempt additional trial of her LAD system.  The marker  wire was then advanced beyond the diffuse stenoses and advanced to the  mid LAD system.  The initial 2.5 balloon used for the circumflex was  then used and dilatation was made in the very calcified proximal  tortuous segment.  However, this balloon was unable to  pass further  beyond this segment.  A new balloon was then inserted to 0.75 and with  some difficulty ultimately this was able to be passed to the more distal  proximal LAD lesion.  Segmental dilatations were then made from this  more distal 80% narrowing in the proximal LAD back up towards the  ostium.  There was some significant improvement in the high-grade 90%  stenosis but due to the vessel tortuosity and calcification, it was felt  that ultimately long stenting would be necessary.  A 2.75 x 33 mm stent  was then attempted to be deployed but this was never able to pass the  very proximal stenosis due to the calcification and sharp angle  tortuosity.  The 2.75 balloon was then reinserted and this again was  unable to cross this proximal segment.  A 2.0 x 6 mm cutting balloon was  then inserted in an attempt to see if this tight segment could be more  optimally opened but the cutting balloon was also unable to pass this  segment.  Ultimately, a 2.75 Dorastar balloon was then advanced and with  much manipulation was again able to cross that proximal lesion to the  more distal proximal LAD lesion.  He had multiple dilatations were made  with this balloon.  Following the last inflation, the patient did become  significantly bradycardic and had a period of brief asystole.  Following  these last series of inflations, the stenoses was improved but was still  residually at least 60% narrowed to 60-70% narrowing proximally which  was markedly improved from the previous 90% stenosis.  At this time, the  patient had been on the table for quite some time with her diagnostic  procedure, intervention to her circumflex prior to the LAD attempt.  It  was felt that she could not tolerate any additional attempted  intervention for optimal result.  Ultimately it may be best to consider  rotational atherectomy with stenting for optimal result, but the patient would not be able to tolerate this presently  due to comfort, inability  to continue to lie flat, and contrast load.  At the completion of the  procedure, she was pain free and there was now complete resolution of  her prior ST-segment elevation.  She left the catheterization laboratory  with stable hemodynamics.   Please refer to Dr. Adelene Idler note for hemodynamic data.  However, the  start of the interventional procedure, her systolic pressure was  approximately 200-205 mm.  During the procedure she received a least 800  mcg of intracoronary nitroglycerin and  also was started on IV  nitroglycerin drip which was titrated up to 40 mcg.   ANGIOGRAPHIC DATA:  Please refer to Dr. Adelene Idler angiographic report.   INTERVENTION TO THE LEFT CIRCUMFLEX CORONARY ARTERY:  At the start of  the procedure the circumflex coronary artery had 10-20% proximal  narrowing and then had diffuse 80% area of narrowing with haziness in  its midsegment.  There is mild 30% distal narrowing.  Following  successful PTCA and stenting with ultimate insertion of a 2.75 x 18-mm  drug-eluting Cypher stent postdilated to 3.0 mm, diffuse 80% stenosis  was reduced to 0%.  There was brisk TIMI III flow and complete  resolution of prior haziness.   Again, a relook of the LAD system before completion of the procedure was  done due to the patients residual left arm discomfort and mild continued  ST-segment elevation.  RAO cranial view disclosed diffusely calcified  proximal tortuous LAD segment with narrowing up to 90% followed by  segmental 50% and 80% proximal narrowing.  During this abbreviated  intervention to the LAD system following PTCA, this area was improved to  60 to less than 70% with complete resolution of arm discomfort and ST  changes.  However, due to the patient's inability to continue with the  procedure, for optimal angiographic result, consideration for high-speed  rotational atherectomy due to the significant calcification proximally  and  stenting may be considered.   IMPRESSION:  1. Successful percutaneous transluminal coronary angioplasty/stenting      diffuse 80% left circumflex mid stenosis with insertion of a 2.75 x      18-mm Cypher stent postdilated 3.0 mm.  2. Percutaneous transluminal coronary angioplasty of diffusely      diseased left anterior descending with calcification and vessel      tortuosity with 90% proximal stenosis followed by 50 and 80%      narrowing with      angiographic percutaneous transluminal coronary angioplasty result      of approximately 60% with resolution of haziness and ST-segment      changes.  Consideration for additional intervention with possible      rotational atherectomy/stenting.           ______________________________  Nicki Guadalajara, M.D.     TK/MEDQ  D:  08/15/2006  T:  08/16/2006  Job:  [id number]   cc:   Antionette Char, MD  Pearson Grippe, M.D.

## 2010-07-30 NOTE — Discharge Summary (Signed)
[name], [name]                         ACCOUNT NO.:  [id number]   MEDICAL RECORD NO.:  [id number]                   PATIENT TYPE:  INP   LOCATION:  6526                                 FACILITY:  [facility]   PHYSICIAN:  Elliot Cousin, M.D.                 DATE OF BIRTH:  10/08/1923   DATE OF ADMISSION:  10/17/2003  DATE OF DISCHARGE:  10/21/2003                                 DISCHARGE SUMMARY   DISCHARGE DIAGNOSES:  1. Unstable angina, myocardial infarction ruled out.  2. Cardiac catheterization per Dr. Aleen Campi on October 20, 2003 revealed a     proximal left anterior descending 70% stenotic lesion.  There was good     antegrade flow.  In the circumflex in the proximal segment there was a     30% stenosis.  There was a concentric 60 to 70% stenosis in the middle     segment of the circumflex.  The left internal mammary artery appears     normal.  There was a new critical stenosis of the proximal right coronary     artery proximal to the prior stent on September 22, 2002.     A. Status post angioplasty with primary stenting of the proximal right        coronary artery lesion.     B. Ejection fraction estimated at 70%.  3. Hyperlipidemia.  4. Hypertension.   SECONDARY DISCHARGE DIAGNOSES:  Please see the past medical history on  history and physical.   DISCHARGE MEDICATIONS:  1. Lipitor 80 mg one-half tablet q.h.s.  2. Nitroglycerin 0.4 mg one pill sublingual as needed for chest pain.  3. Benicar take one whole pill daily.  4. Metoprolol 25 mg b.i.d.  5. Protonix 40 mg daily.  6. Plavix 75 mg daily.  7. Multivitamin with iron one daily.   DISPOSITION:  The patient was discharged to home in improved and stable  condition on October 21, 2003.  She has a follow up appointment with her  primary care physician Dr. [name] on Wednesday October 29, 2003 at 10:45  A.M.  The patient was advised to follow up with Dr. Aleen Campi in  approximately three weeks.   CONSULTATIONS:  John R.  Aleen Campi, M.D.   PROCEDURE:  Patient underwent cardiac catheterization on October 20, 2003 with  results as above.   HISTORY OF PRESENT ILLNESS:  The patient is a 77 year old lady with a past  medical history significant for coronary artery disease status post  myocardial infarction in July 2004, status post percutaneous transluminal  coronary angioplasty and stenting to the right coronary artery in July of  2004 who presented to the emergency department on October 17, 2003 with a  several day history of chest pain.  The patient stated that the chest pain  started approximately 4 days ago and has been intermittent each day.  The  pain initially occurred when the patient was  swimming.  The patient lasted  about 30 to 40 minutes each time and subsided spontaneously.  It also  subsided with one sublingual on one occasion.  The patient was associated  with mild shortness of breath and occasional diaphoresis but there was no  radiation of chest pain.  The chest pain was located on the left side.  The  patient was therefore admitted for further evaluation and management.  For  additional details, please see the dictated admission history and physical  examination report.   HOSPITAL COURSE:  PROBLEM #1:  CORONARY ARTERY DISEASE WITH UNSTABLE ANGINA:  Management started in the emergency department when Dr. Alanda Amass,  cardiologist, was consulted.  Dr. Alanda Amass started treatment with  intravenous heparin, intravenous nitroglycerin, Plavix and aspirin.  At the  time of the initial assessment the patient's chest pain had resolved.  The  patient's examination was unremarkable on admission.  She was afebrile with  pulse rate 58, respirations 20 and oxygen saturation of 97% on room air.  Her blood pressure was elevated at 190/80.  Her electrocardiogram revealed  sinus bradycardia with a heart rate of 55 and questionable Q waves in the  anterior leads.  There was no evidence of ST elevations or  depressions.  The  patient's chest x-ray on admission revealed mild interstitial prominence but  no active disease.  The patient was eventually admitted to a telemetry bed.  Cardiac enzymes were ordered every 8 hours x3.  A fasting lipid panel, TSH  and hemoglobin A1C were also ordered  for assessment.  The patient's initial  cardiac enzymes were negative with a troponin-I of 0.03, CK of 58 and CK-MB  of 1.9.  the patient's cardiac enzymes remained negative X3 sets during the  hospital course.  The patient's TSH was mildly elevated at 5.616 with normal  range being 0.350 to 5.5.  the patient's hemoglobin A1C was 6.6.  The  fasting lipid panel results revealed a total cholesterol of 233,  triglycerides 115, HDL cholesterol 40, LDL cholesterol 170.  The patient was  therefore started on Lipitor 40 mg q.h.s.  Given the patient's history of a  bleeding duodenal ulcer, she was treated empirically with Protonix 40 mg  b.i.d. rather than the 40 mg daily she had been prescribed in the outpatient  setting.  She was also treated with a carbohydrate-modified heart-healthy  diet for her history of pre-diabetes.   Dr. Aleen Campi provided the cardiology follow up assessment.  Per his  assessment the patient needed a cardiac catheterization. This was performed  on October 20, 2003.  The cardiac catheterization revealed a critical lesion  in the proximal right coronary artery which was angioplastied and stented.  It was also  noted that the patient had a 30% stenosis in the proximal  segment of the circumflex.  There was a 70% stenosis in the proximal left  anterior descending, however, there was good antegrade flow.  There was also  a concentric 60 to 70% stenosis in the middle segment of the circumflex.  The patient's ejection fraction was estimated at 70%.  The abdominal aorta  was normal in appearance.  The renal arteries were normal in appearance. The common iliac arteries also appeared normal.  The  patient was treated with  Integrilin post cardiac catheterization.  The patient also was maintained on  aspirin and Plavix following the cardiac catheterization.  The patient did  have some mild intermittent chest pain following the cardiac  catheterization.  The nitroglycerin was maintained  until the patient was  chest pain free today. The nitroglycerin drip, heparin drip as well as the  Integrilin were all eventually discontinued. The patient was completely  chest pain free at rest and with ambulation at the time of hospital  discharge.   It is important to note that the patient's blood pressures were elevated  during the hospital course, ranging between 150-170 systolically.  The  Lopressor was temporarily changed to Toprol XL 25 mg daily when it was felt  that the Lopressor was causing a decrease in the patient's heart rate.  However, the Lopressor was restarted at 25 mg b.i.d. when it was felt that  the patient was asymptomatic secondary to a mild bradycardia with heart rate  ranging between 55 and 60.  The Benicar, however, was increased to one whole  pill a day rather than one-half pill a day.  The patient believes she takes  a 40 mg dose of Benicar at home.  The patient did not have her medications  with her at the time.  She was only taking one-half pill of Benicar pre day.  The patient was advised to increase the Benicar to one whole pill a day when  she returns home.  Further management per Dr. [name] and Dr. Aleen Campi.   DISCHARGE LABORATORY DATA:  White blood cell count 9.7, hemoglobin 13.3,  hematocrit 39.2, MCV 88.2, platelet count 253,000.  Sodium 140, potassium  3.5, chloride 107, cO2 25, glucose 121, BUN 13, creatinine 1.0, calcium 8.8.  Pro Time 13.1, INR 1.0.                                                Elliot Cousin, M.D.    DF/MEDQ  D:  10/21/2003  T:  10/22/2003  Job:  [id number]   cc:   [name]. Eloise Harman., M.D.  [address]   [place]  [place] [address]  Fax: [phone]   Aram Candela. Aleen Campi, M.D.  [address]  [place]  [place] [address]  Fax: [phone]

## 2010-07-30 NOTE — Discharge Summary (Signed)
[name], [name]                         ACCOUNT NO.:  [id number]   MEDICAL RECORD NO.:  [id number]                   PATIENT TYPE:  INP   LOCATION:  0349                                 FACILITY:  [facility]   PHYSICIAN:  Elliot Cousin, M.D.                 DATE OF BIRTH:  [birth year]   DATE OF ADMISSION:  01/07/2003  DATE OF DISCHARGE:                                 DISCHARGE SUMMARY   DISCHARGE DIAGNOSES:  1. Upper gastrointestinal bleed secondary to a duodenal ulcer;     a. Thought to be secondary to Plavix and aspirin.     b. Helicobacter pylori negative.  2. Anemia secondary to #1;     a. Status post two units of packed red blood cell transfusion.  3. Leukocytosis felt to be a reactive leukocytosis secondary to the     ulceration.  4. Coronary artery disease status post acute myocardial infarction in July     of 2004;     a. Cardiac catheterization per Dr. Elsie Lincoln on September 22, 2002 revealed a 75%        proximal left anterior descending lesion, a 90% mid circumflex lesion,        a 50% proximal right lesion and a 99% mid right coronary artery        infarct related vessels;     b. Status post percutaneous transluminal coronary angioplasty and stent        to the right coronary artery.  Good left ventricular systolic        function.  5. Esophageal stricture. Schatski's ring per esophagogastroduodenoscopy on     January 08, 2003 per Dr. Leone Payor.  6. Hiatal hernia per esophagogastroduodenoscopy on January 08, 2003 per Dr.     Leone Payor.  7. Type 2 diabetes mellitus (diet controlled)     a. Hemoglobin A1c was 6.4.  8. Hypertension.  9. E. coli urinary tract infection.  10.      Hyperlipidemia.  11.      Status post laparoscopic cholecystectomy in October of 2004 per Dr.     Roseanne Reno.  12.      Gastroesophageal reflux disease.   DISCHARGE MEDICATIONS:  1. Protonix 40 mg b.i.d.  2. Centrum Silver vitamin with iron supplement one daily.  3. Metoprolol continue same dose  b.i.d.  4. Benicar continue same dose, one half pill daily.  5. DO NOT TAKE PLAVIX AND ASPIRIN FOR TWO WEEKS.   DISPOSITION:  The patient was discharged to home on January 12, 2003 in  improved and stable condition.   FOLLOW UP:  1. She was advised to follow-up with her primary care physician, Dr. Dimas Alexandria, in one to two weeks.  2. She will also need a follow-up appointment with the gastroenterologist,     Dr. Virginia Rochester or Dr. Leone Payor, in the next one to two months for follow-up of  the esophageal stricture.   CONSULTATIONS:  1. Iva Boop, M.D. [facility]    PROCEDURES:  1. Esophagogastroduodenoscopy on January 08, 2003 per Dr. Leone Payor.  The     results revealed esophageal stricture, hiatal hernia, duodenal ulcer     acute with hemorrhage.   HISTORY OF PRESENT ILLNESS:  The patient is a 77 year old lady with a past  medical history significant for recent PTCA and stent to her right coronary  artery in July of 2004 who presented to her primary care physician's office  on January 07, 2003 with a chief complaint of hematemesis and black stools.  The patient's primary care physician is Dr. [name].  The patient was  actually seen by Dr. Pincus Sanes physician assistant, Ms. Amaryllis Dyke.  The  patient gives a history of crampy abdominal pain over the 24 to 48 hours  prior to presentation.  The crampy abdominal pain was followed by black  stools times three.  Subsequently, the patient had several episodes of  vomiting fresh red blood.  The patient complained of feeling dizzy and  lightheadedness; however, she denied passing out.  She was recently treated  with a combination of Plavix and aspirin after the PTCA and stent to the  right coronary artery.  The patient was therefore transferred to the  emergency room from Dr. Pincus Sanes office for admission for a GI bleed.   HOSPITAL COURSE:  1. UPPER GASTROINTESTINAL BLEED SECONDARY TO DUODENAL ULCER.  When the     patient arrived to  the emergency department her systolic blood pressure     ranged between 109 and 129.  She had been hypotensive in Dr. Pincus Sanes     office with a systolic blood pressure of 90.  The hemoglobin on admission     was 11.3 with a hematocrit of 33.0 and an MCV of 84.5.  The patient also     had a white blood cell count of 16.4.  She was treated with aggressive     intravenous fluids.  The Plavix and aspirin were discontinued.  Dr.     Leone Payor, gastroenterologist, was consulted for further management.  The     patient was actually admitted to the step-down unit.  Protonix 40 mg IV     daily was started empirically.  The patient was kept NPO initially. Her     hemoglobin and hematocrit were assessed every six hours for the first 48     hours.   The patient's hemoglobin fell to 9.3 the following day.  The patient was  typed and crossed for two units of packed red blood cells and transfused one  unit on the second hospital day.  The following day the patient's hemoglobin  fell to a nadir of 7.4.  She was transfused the second unit.  On hospital  day number two, Dr. Leone Payor performed an EGD.  The endoscopy revealed an  ulcer in the duodenal apex which revealed a non-bleeding vessel.  However,  there was evidence of acute hemorrhage.  Dr. Leone Payor injected the area with  epinephrine and coagulated the visible vessel with a Gold probe.  The  patient was subsequently started on a Protonix infusion.   The patient's blood pressure medications of metoprolol and Benicar were held  during the hospitalization.  Her blood pressures were relatively hypotensive  with the systolic blood pressure ranging between 100 and 120 systolically.  She became less symptomatic on hospital day number three.  She did not  experience any bloody stools or  black tarry stools during the hospital  course.  The nausea, vomiting and hematemesis totally resolved during the  hospital course.  Following the two units of packed red  blood cell transfusion, the patient's hemoglobin improved to 10.0.  Her blood pressures  began to increase and IV fluids were tapered off.  The Protonix infusion was  stopped after 24 hours and the patient was placed on Protonix 40 mg b.i.d.  A clear liquid diet was eventually started followed by full liquids and then  a soft carbohydrate modified 4 gram sodium diet.  The patient tolerated the  progression in her diet very well without any abdominal pain, nausea or  vomiting.  The patient's follow-up hemoglobin prior to hospital discharge  was 10.0 and then 10.6 the following day.  Her hemoglobin on the day of  discharge was 10.9 with a hematocrit of 31.4.  The patient currently is  hemodynamically stable and had no further episodes of GI bleed during the  hospital course.   The patient will need to follow-up with either Dr. Leone Payor or Dr. Virginia Rochester for  further management of the mild esophageal stricture which was found on EGD.  Dr. Leone Payor recommended that the patient discontinue the aspirin and Plavix  for an additional two weeks.  After two weeks, the patient should be  restarted on either aspirin, Plavix or both for her history of coronary  artery disease.  Further management will be deferred to her primary care  physician, Dr. [name].   1. CORONARY ARTERY DISEASE.  As stated above, the patient's aspirin and     Plavix were discontinued during the hospital course secondary to the     acute GI bleed.  The patient is status post PTCA and stent to the right     coronary artery per Dr. Elsie Lincoln in July of 2004.  The patient will need to     have antiplatelet therapy in the future; however, per Dr. Marvell Fuller     recommendations the Plavix and aspirin will be discontinued for two     weeks.  Per Dr. [name], the patient should be restarted on either     aspirin or Plavix or both in two weeks.  This was also explained to the     patient.  2. HYPERTENSION.  The patient's blood pressures were  relatively hypotensive     during the first three to four days of hospitalization.  Therefore, the     metoprolol and Benicar were discontinued.  However, on hospital day     number four to hospital day number five her systolic blood pressures     began to increase in to the 140's to 160's.  The Benicar was restarted at     20 mg daily and the metoprolol was started back at 25 mg daily.  The     patient was advised to restart her medications at the pre-hospital doses     of metoprolol twice a day and Benicar half a tablet per day.  3. TYPE 2 DIABETES MELLITUS.  The patient's blood sugars were assessed twice     daily.  They ranged between 120 and 140.  Occasionally she had a venous     glucose in the 160 range.  The patient was treated with a sliding scale     insulin regimen as well as a carbohydrate modified diet.  A hemoglobin     A1c was assessed and found to be 6.4.  The patient will continue to  be     treated with diet alone.  Further management will be per her primary care     physician, Dr. [name]. 4. E. COLI URINARY TRACT INFECTION.  The patient had an elevated white blood     cell count of greater than 16,000 during the initial course of the     hospitalization.  A urinalysis was assessed and found to be positive for     white blood cells and positive for leukocyte esterase as well as rare     bacteria.  A urine culture was obtained and it was positive for E. coli.     The patient was started on Cipro at 250 mg b.i.d.  She completed a three     day course during the hospitalization.  The other potential cause of the     patient's leukocytosis could have been reactive secondary to the ulcer.   DISCHARGE LABORATORY DATA:  Hemoglobin 10.9, hematocrit 31.4.  Sodium 140,  potassium 3.5, chloride 108,  C02 29, glucose 133, BUN 5, creatinine 0.9,  calcium 8.4, magnesium 1.6.  Hemoglobin A1c 6.4.                                               Elliot Cousin, M.D.    DF/MEDQ  D:   01/12/2003  T:  01/12/2003  Job:  [id number]   cc:   [name]. Eloise Harman., M.D.  [address]  [place]  [place] [address]  Fax: [phone]   Georgiana Spinner, M.D.  [address]  [place]  [place] [address]  Fax: [phone]   Iva Boop, M.D. [facility]

## 2010-07-30 NOTE — Cardiovascular Report (Signed)
[name], [name]                         ACCOUNT NO.:  [id number]   MEDICAL RECORD NO.:  [id number]                   PATIENT TYPE:  INP   LOCATION:  2923                                 FACILITY:  [facility]   PHYSICIAN:  Madaline Savage, M.D.             DATE OF BIRTH:  1923/12/25   DATE OF PROCEDURE:  09/22/2002  DATE OF DISCHARGE:                              CARDIAC CATHETERIZATION   PROCEDURES PERFORMED:  1. Emergency cardiac catheterization.     a. Combined left heart catheterization consisting of left ventricular        angiography, retrograde left heart catheterization and coronary        angiography.     b. Percutaneous right coronary direct stent deployment.   ENTRY SITE:  Right femoral.   DYE USED:  Omnipaque.   MEDICATIONS GIVEN:  Fentanyl 25 mg IV for sedation, Lopressor 5 mg IV for  blood pressure. The patient came to the cath lab with a bolus of heparin  going and had intravenous Integrelin by double bolus technique given in the  cath lab.   PATIENT PROFILE:  The patient is a 77 year old female patient of Dr. Higinio Plan and Dr. Charolette Child who has had a weeks worth of chest  discomfort, worse in the last day and a half, who presented to [facility] and was admitted around 1:30 this afternoon by Dr. Ricki Miller the  internist on call for Dr. [name].  The patient had no ST segment elevation  at the time of admission and was being treated as a rule out myocardial  infarction.  Later this evening, the patient developed worsened chest pain,  ST segment elevations with inferior injury noted and I was called.  I  assessed the patient and bolused intravenous heparin and brought her to the  [facility] Cath Lab.  A percutaneous coronary artery intervention was performed in  the right coronary artery  uneventfully and without complications.  She will  be transferred to the CCU.   RESULTS:  PRESSURES:  Left ventricular pressure 160/17, end-diastolic  pressure 30.   Central aortic pressure 160/75, mean of 115.  No aortic valve  gradient by pullback technique.   ANGIOGRAPHIC RESULTS:  1. No pericardial, valvular or coronary calcification was seen.  2. The left main coronary artery contained mild calcification.  3. The left anterior descending coronary artery contained a segmental     stenosis of 75% proximally before the first septal perforator branch.     The mid and distal LAD showed luminal irregularities only.  Three small     diagonal branches arose from the  LAD and these were unremarkable.  4. The circumflex was a nondominant vessel containing a mid circumflex     stenosis of 90% with good distal runoff into a fairly normal vessel.  5. The right coronary artery which is the culprit vessel responsible for the  infarction was a large and dominant vessel giving rise to both the     posterior lateral and a posterior descending branch.  There was a 50%     lesion at the end of the proximal segment of the vessel as the vessel     began to turn down into the mid segment between acute marginal branches     #1 and #2.  The mid right coronary artery there was 99% B1 lesion about 8     mm in length with a very slender lumen. The distal vessel appeared to be     normal and did show TIMI-3 flow.  6. The left ventricle showed inferobasal hypokinesis of moderate degree     without mitral regurgitation.  No LV thrombus and hyperdynamic wall     motion of the inferoapical, apical and entire anterior wall.   Percutaneous intervention was accomplished by using a 7 French right Judkins  4 guide catheter without side holes.  A long Patriot wire and no  predilatation balloon.  The guide wire was passed easily across the vessel  after Integrelin bolus and heparin showed an ACT of approximately 230  seconds.   The guide wire was easily placed across the lesion and into the posterior  lateral branch.  The stent which was a 3.5 x 13-mm Cypher stent crossed the   lesion with ease.  No resistance  whatsoever and was dilated to 14  atmospheres of pressure corresponding to an anticipated lumen diameter of  3.64.  We post dilated once with a Quantum Maverick balloon 3.5 x 12-mm in  length and post dilated to approximately 17 atmospheres of pressure  corresponding to a lumen diameter of 3.65.  TIMI-3 distal flow was preserved  and the 99% lesion in the mid RCA was reduced to 0% residual.   No complications occurred.  The patient tolerated the procedure very well.  We gave one dose Lopressor post procedure to drop blood pressure from 180 to  164 systolic.  We will continue IV nitro, Integrelin an restart heparin  after sheath is  pulled.   FINAL DIAGNOSES:  1. Acute coronary syndrome with interval development of acute inferior wall     injury.  2. Three-vessel coronary artery disease as described above.     a. 75% proximal left anterior descending.     b. 90% mid circumflex.     c. 50% proximal right.     d. 99% mid right coronary artery infarct related vessel.  3. Good left ventricular systolic function.   The patient will be taken to the CCU.  The patient's primary cardiologist  Dr. Aleen Campi will be notified of her admission and care will be handed over  to him once he is on board.                                                 Madaline Savage, M.D.    WHG/MEDQ  D:  09/22/2002  T:  09/23/2002  Job:  [id number]  Aram Candela. Aleen Campi, M.D.  [address]  [place]  [place] [address]  Fax: [phone]   Cath Lab   CCU at [facility]   cc:   Aram Candela. Aleen Campi, M.D.  [address]  [place]  [place] [address]  Fax: [phone]   Cath Lab  CCU at [facility]

## 2010-07-30 NOTE — H&P (Signed)
NAME:  [name], [name] NO.:  [id number]   MEDICAL RECORD NO.:  [id number]                   PATIENT TYPE:  INP   LOCATION:  1826                                 FACILITY:  [facility]   PHYSICIAN:  Elliot Cousin, M.D.                 DATE OF BIRTH:  Jul 27, 1923   DATE OF ADMISSION:  10/17/2003  DATE OF DISCHARGE:                                HISTORY & PHYSICAL   PRIMARY CARE PHYSICIAN:  Dr. Dimas Alexandria.   CARDIOLOGIST:  Dr. Aleen Campi.   CHIEF COMPLAINT:  Chest pain.   HISTORY OF PRESENT ILLNESS:  The patient is a 77 year old lady with a past  medical history significant for CAD, status post myocardial infarction in  July of 2004, status post PTCA and stent to the right coronary artery in  July of 2004 who presents to the emergency department after a several day  history of chest pain.  The patient states that the chest pain started  approximately four days ago while she was swimming.  The pain is described  as a pressure and was left sided at the time.  The pain lasted for about 30  to 40 minutes and subsided spontaneously.  The patient felt that the pain  subsided with rest.  Over the following few days, the patient had  intermittent chest pain, generally in the range of four to five times per  day.  Each time the chest pain lasted 30 to 40 minutes.  The pain occurred  with rest and with activity.  However, the patient noticed that the pain  intensified with activity.  At it's worst, the patient's chest pain was  rated as an 8/10 in intensity.  The patient took one sublingual  nitroglycerin without any benefit today.  The pain was associated with mild  shortness of breath and occasional diaphoresis.  She denied any nausea and  she denied any radiation of the pain.  There was no pleurisy.  No recent  history of cough.  No heavy lifting.  No reason to believe that she has had  chest wall pain.  The patient was seen by Dr. Harvie Junior. several days  ago.  The P.A. recommended restarting Protonix secondary to her history of  gastroesophageal reflux disease.  The patient had previously been taking  Zantac.  After several days the Protonix did not relieve the patient's  symptoms.  The patient was therefore transferred to the emergency department  for further management and evaluation.   Dr. Alanda Amass, a cardiologist, was consulted from the emergency department.  He has already recommended starting intravenous heparin, intravenous  nitroglycerin, Plavix and aspirin.  The patient's EKG revealed sinus  bradycardia with a heart rate of 55 and questionable anterior Q waves.  The  patient's blood pressure 190/80; pulse rate 58; oxygen saturation 97% on  room air.   PAST MEDICAL HISTORY:  1. Coronary artery disease, status post  myocardial infarction September 22, 2002.     A. Cardiac catheterization per Dr. Elsie Lincoln September 22, 2002 revealed a 75%        proximal LAD lesion, 90% mid circumflex lesion, 50% proximal right        lesion, and a 99% mid right coronary artery lesion.  The left        ventricle showed inferior basal hypokinesis of moderate degree without        mitral regurgitation.     B. Status post PTCA and stent to the right coronary artery.  2. Upper GI bleed secondary to duodenal ulcer, thought to be provoked by     NSAIDs and Plavix, October of 2004.  3. Anemia secondary to upper GI bleed.  4. Esophageal stricture, Schatzki's ring per EGD on January 08, 2003 per Dr.     Leone Payor.  5. Hiatal hernia per EGD on January 08, 2003 per Dr. Leone Payor.  6. Type 2 diabetes mellitus (diet controlled).  Hemoglobin A1c was 6.4 in     October of 2004.  7. Hypertension.  8. History of hyperlipidemia.  9. Status post laparoscopic cholecystectomy in October of 2004 per Dr.     Roseanne Reno.  10.      DJD.  11.      History of E. coli urinary tract infection.   MEDICATIONS:  1. Protonix 40 mg q.d.  2. Multivitamin with iron one q.d.  3. Metoprolol 25  mg b.i.d.  4. Benicar 40 mg one half tablet daily.  5. Plavix 75 mg daily.   ALLERGIES:  No known drug allergies.   SOCIAL HISTORY:  The patient is married and lives in [place], [place].  She has two children.  She is currently a housewife.  However,  she did work at the [employer] and the [employer] in the past.  She is retired  now.  She does not smoke nor does she drink alcohol or use illicit drugs.  She still drives.  She can read and write.   FAMILY HISTORY:  The patient's father died of complications secondary to TB  at 102 years of age.  Her mother died at 35 secondary to pneumonia and old  age.   REVIEW OF SYSTEMS:  The patient's review of systems is negative for  headache, visual changes, difficulty swallowing, pleurisy, cough, upper  respiratory infection symptoms, nausea, vomiting, diarrhea, melena,  hematochezia, stomach pains, dysuria.  Her review of systems is positive for  occasional swelling in the legs and occasional arthritic pain in her legs  and knees.   PHYSICAL EXAMINATION:  VITAL SIGNS:  Temperature 97.5; blood pressure  190/80; pulse 58; respiratory rate 20; oxygen saturation 97% on room air.  GENERAL:  The patient is a pleasant, overweight, 77 year old, Caucasian  woman who is currently lying in bed in no acute distress.  HEENT:  Head is normocephalic, nontraumatic.  Pupils equal, round and  reactive to light.  Extraocular movements are intact.  Conjunctivae are  clear.  Sclerae are white.  Tympanic membranes are clear bilaterally.  Nasal  mucosa is moist; no drainage.  Oropharynx reveals moist mucous membranes.  No posterior exudates or erythema.  Teeth are in good repair.  NECK:  Supple, obese, no adenopathy, no thyromegaly, no bruit, no JVD.  LUNGS:  Clear to auscultation bilaterally.  HEART:  S1, S2 with a mild systolic murmur and bradycardia.  ABDOMEN:  The abdomen has several small well healed epigastric and right upper quadrant scars.  The  abdomen is obese, positive bowel sounds, soft,  nontender and nondistended.  No hepatosplenomegaly.  EXTREMITIES:  The patient has mild arthritic changes in her knees  bilaterally.  Pedal pulses are 2+ bilaterally.  No pretibial edema.  No  pedal edema.  The patient has a good range of motion of all of her joints.  NEUROLOGIC:  The patient is alert and oriented times three.  Cranial nerves  II-XII are intact.  Strength is 5/5 throughout.  Sensation is intact  throughout.   ADMISSION LABORATORIES:  Stool was guaiaced and was guaiac negative.  Other  labs are pending.  EKG revealed normal sinus rhythm with a heart rate of 55.  Questionable Q waves in the anterior leads.  Chest x-ray pending.   ASSESSMENT:  1. Chest pain - The chest pain is suggestive of angina, stable versus     unstable angina.  The patient is chest pain free at this time.  She does     have mild bradycardia, which is presumed to be secondary to the beta     blocker.  2. History of diet controlled type 2 diabetes mellitus - On review of the     patient's labs from Dr. Pincus Sanes office, the patient's glucose was 228.     The elevated glucose is not fasting.  3. Hypertension.   PLAN:  1. Dr. Alanda Amass, cardiologist on call for Dr. Aleen Campi, has been consulted.     He has ordered intravenous heparin, intravenous nitro, Protonix, Plavix     and aspirin.  2. The patient will be admitted to a telemetry bed.  Cardiac enzymes q.8 h.     times three will be ordered.  3. Benicar will be restarted at 20 mg daily.  We will add metoprolol at 12.5     mg b.i.d. and hold for blood pressure less than 95 and heart rate less     than 58.  The metoprolol will need to be titrated upward as the patient's     heart rate tolerates it.  4. We will check a fasting lipid panel and TSH in the a.m.  5. We will check a hemoglobin A1c in the a.m.  6. We will order a portable chest x-ray now.  We will check an EKG in the     a.m.  7. We will  check a stat CBC and CMET now.  8. We will assess the patient's glycemia by checking capillary blood sugars     before each meal and at bedtime.  If the patient's capillary blood sugars     are elevated, we will cover with a sliding scale insulin regimen.                                                Elliot Cousin, M.D.    DF/MEDQ  D:  10/17/2003  T:  10/17/2003  Job:  [id number]   cc:   [name]. Eloise Harman., M.D.  [address]  [place]  [place] [address]  Fax: [phone]   Aram Candela. Aleen Campi, M.D.  [address]  [place]  [place] [address]  Fax: [phone]

## 2010-07-30 NOTE — Op Note (Signed)
[name], [name]               ACCOUNT NO.:  [id number]   MEDICAL RECORD NO.:  [id number]          PATIENT TYPE:  AMB   LOCATION:  DSC                          FACILITY:  [facility]   PHYSICIAN:  [name], M.D.DATE OF BIRTH:  1923-04-04   DATE OF PROCEDURE:  07/07/2004  DATE OF DISCHARGE:                                 OPERATIVE REPORT   [facility] [id number]   PREOPERATIVE DIAGNOSIS:  Carcinoma, left breast, upper outer quadrant.   POSTOPERATIVE DIAGNOSIS:  Carcinoma, left breast, upper outer quadrant.   OPERATION PERFORMED:  Left partial mastectomy with blue dye injection and  axillary sentinel lymph node biopsy one node.   SURGEON:  [name], M.D.   ASSISTANT:   ANESTHESIA:  General.   INDICATIONS FOR PROCEDURE:  This is a 77 year old who recently presented  with a right breast mass which was palpable in the upper outer quadrant and  core biopsy showed invasive cancer.  After a lengthy discussion with the  patient we elected to proceed to left partial mastectomy and sentinel node  biopsy.   DESCRIPTION OF PROCEDURE:  The patient was seen in the holding area and she  had no further questions.  She was taken to the operating room and after  satisfactory general anesthesia had been obtained, the breast was prepped  with alcohol.  The time out occurred.  I injected the subareolar area with  dilute methylene blue, massaged that in.  The breast was then prepped and  draped.   I used a NeoProbe to identify the hot area in the axilla, made a transverse  incision, divided subcutaneous tissues, entered the axilla and found a hot  node which was not blue and in fact, saw no blue lymphatics entering the  axilla at all.  Once that node was removed, the NeoProbe showed no other hot  areas.  We had counts of about 2300 in that node.  There was no blue  lymphatics as noted and I did not palpate any adenopathy.  A moist pack was  placed here.   Attention was returned back  to the breast.  I used the ultrasound to help  with preoperative localization of the tumor and it was located medial to the  core entry site.  I made an elliptical incision with the lateral end of it  being at the core needle site, biopsy site and then medially almost to the  areolar margin.  I then used cautery and took a wide excision of breast  tissue down to the chest wall.  There was a firm area directly underneath  but the tumor was more medial and deep.  Once I got this out, I felt I had a  tiny margin around the tumor but it was basically at the medial end of the  lumpectomy with margins close medially, superiorly, inferiorly and deep.  I  went ahead and as a single thick sheet of tissue re-excised all those  margins down to muscle and what I thought would be well away from the tumor.  This was sent as a separate specimen.  I injected some Marcaine here after making sure everything was dry and then  closed the subcu with some 3-0 Vicryl and the skin with a 4-0 Monocryl  subcuticular.  The axillary incision was checked for hemostasis and remained  dry while we were working and it was injected with some Marcaine and closed  in a similar fashion.  Dr. Delila Spence reported that the sentinel node was  negative.  The firm area that I felt directly under the skin looked like fat  necrosis, possibly related to her biopsy and indeed the tumor was fairly  close on margins but with the re-excision, I felt we should be okay.   The patient tolerated the procedure well.  There were no operative  complications.  All counts were correct.      CJS/MEDQ  D:  07/07/2004  T:  07/07/2004  Job:  [id number]   cc:   [name]. Eloise Harman., M.D.  [address]  [place]  [place] [address]  Fax: [phone]

## 2010-07-30 NOTE — Consult Note (Signed)
[name], [name]                         ACCOUNT NO.:  [id number]   MEDICAL RECORD NO.:  [id number]                   PATIENT TYPE:  INP   LOCATION:  0379                                 FACILITY:  [facility]   PHYSICIAN:  [name], M.D.                DATE OF BIRTH:  07/26/1923   DATE OF CONSULTATION:  DATE OF DISCHARGE:                                   CONSULTATION   Patient of Dr. [name] and Dr. Janae [name]. [name].   I appreciate the opportunity to participate in the care of the very nice 77-  year-old, [name], date of birth 10-17-1923, a patient of  [name]. [name], M.D. and [name]. [name], M.D., by providing  consultative services, at Dr. Tenna Child request, in regard to [name]'s  severe bradycardia during surgery.   She had cholecystectomy today for known gallbladder disease and recent  marked exacerbation of symptoms.  Approximately two days prior to this  admission, she developed nausea and vomiting and also pain in the right mid  and upper abdomen.  All of this started after eating some [employer].  She was unable to keep much down after the onset of that, and eventually  came to medical attention.   Her past medical history includes the fact that she had a hysterectomy  approximately 40 years ago.  She has known coronary artery disease.  She had  unstable angina in July of this year and had two stents placed by [name], M.D. on an emergency basis.  At that time, she had a troponin I of  0.67 and ST abnormalities on her EKG.  She had a 99% lesion in the mid right  coronary artery, and this was successfully stented.  At the time of the  procedure, she had a well-preserved left ventricular ejection fraction of  55%.  She says that she has had no angina since then and has been able to  return to her usual level of activity.  She had had episodes of syncope in  2001, and these were not readily explained.   She had an implantable loop  recorder for approximately 14 months and never had identification of a  bradycardia or other arrhythmia potentially able to cause syncope.  More  remotely, she had a hysterectomy about the age of 89.  She smoked until  about 40 years ago.   FAMILY HISTORY:  Her mother died at 40 of terminal pneumonia.  Her father  died at 76 apparently of old age.  He had had tuberculosis in World War I  and always had a terrible cough.  She has a sister and three brother, none  of whom has any coronary artery disease or other known heart problem.   SOCIAL HISTORY:  She has been married to her husband for 57 years.  They  have two  children and seven grandchildren.   REVIEW OF SYSTEMS:  CONSTITUTION:  She had no definite fever or shaking  chill with the cholecystitis.  She denies claudication or edema.  EYES:  No  diplopia or blurring.  She does wear glasses at all times.  ENT:  She has  had some mild deafness.  She denies dizziness or tinnitus.  She does have a  bridge in the top front and on the bottom in the right and otherwise her own  teeth.  CARDIOVASCULAR:  See the past medical history.  As noted, she has  had no angina since the stent was placed.  RESPIRATORY:  No cough or  wheezing.  Her smoking history is noted above.  GI:  She says she has had  stomach trouble and heartburn all her life.  She also was treated for a  hiatal hernia with ranitidine.  GU:  No dysuria or pyuria.  MUSCULOSKELETAL:  She is bothered by pain in her joints and says that her hips and back are  particularly painful after lying in bed for this length of time.  SKIN AND  BREASTS:  No rash or nodule.  NEUROLOGIC:  Her history of syncope is noted  above.  She has not had any very recent syncope.  PSYCHIATRIC:  No  depression or hallucination.  ENDOCRINE:  She has been identified as having  hyperlipidemia since her coronary stent, and she is being treated for the  hyperlipidemia.  She has not  required treatment recently for elevated blood  sugar.  HEME AND LYMPHS:  No swelling in the neck, axilla, or groin.  ALLERGIC/LYMPHATIC:  No known drug allergy.  All the remaining systems in  her comprehensive 14 system review are negative.   PHYSICAL EXAMINATION:  VITAL SIGNS:  Blood pressure 140/80, pulse 60 and  regular, respirations 18 and unlabored.  GENERAL:  She is a well-developed, well-nourished woman, who looks about her  stated age of 39.  She is slightly sleepy now with postoperative pain  medication.  She is oriented to person, place, and time.  Her mood and  affect are pleasant.  HEENT:  Her conjunctivae and lids reveal no xanthelasma, icterus, or arcus  senilis.  She has the partial dentures mentioned above and otherwise a good  set of her own teeth, which are in good repair.  The oral mucosa reveals no  pallor or cyanosis.  NECK:  Supple and symmetrical.  The trachea is midline and mobile.  There is  no palpable thyromegaly or cervical node, no carotid bruit or JVD.  LUNGS:  Her respiratory effort is normal.  Her lungs are clear to  auscultation and percussion.  BACK:  She is not easily able to move, and her back is not examined nor is  her gait.  MUSCLE STRENGTH AND TONE:  Generally age-appropriate.  CARDIAC:  The cardiac apical impulse is cryptic.  The left border of her  cardiac illness with within the left anterior axillary line.  Her heart  rhythm is regular; rate is normal.  There is a grade 1-2/6 systolic murmur  over the right upper sternal border without radiation.  There is no gallop  sound.  Her digits and nails reveal no clubbing or cyanosis.  SKIN AND SUBCUTANEOUS TISSUE:  No stasis, dermatitis, or ulcer.  ABDOMEN:  Mildly obese without obvious mass.  There is mild tenderness over  the incision in the right upper quadrant.  The liver is otherwise not  palpably enlarged nor is  the spleen.  The abdominal aorta is not palpable, and there is no bruit.  The  femoral arteries are not palpable, and there is  no bruit.  EXTREMITIES:  Her pedal pulses are intact, 2+ dorsalis pedis and posterior  tibials bilaterally.  Her legs reveal no edema or varicosity.   A 12-lead EKG done at 10:00 this morning shows sinus rhythm, lack of R waves  from leads V1 to V3, suggestive of a prior septal myocardial infarction, and  small Q waves in the inferior leads as well.  There is no acute ST-T wave  abnormality.  A single rhythm strip which apparently was done during or  immediately after surgery, at 11:50 a.m., and which has annotated nausea and  vomiting, demonstrates a heart rate of 15-20 beats a minute with 4 second  pauses and complete AV block with an extremely slow junctional escape  rhythm.   This is a 77 year old woman with known coronary heart disease, with a  history of syncope and difficulty identifying the cause previously, who has  now demonstrated on equivocal evidence of sick sinus syndrome.   I think that given the amount of information available, we would be remiss  not to recommend pacemaker implantation at this point.  Certainly, she is at  much higher than average risk of developing syncope related to  bradyarrhythmias in the future given the documentation that we have now and  failure to place a pacemaker in the relatively near future could certainly  be complicated by falling, hip or shoulder fracture, or other disastrous  complication.  We will recommend pacemaker implantation to her and [name]. [name], M.D., and Dr. Lendell [name] will make arrangements with Aram Candela.  Tysinger, M.D. to provide that for her.   I appreciate the opportunity to see this very nice lady.  I think she is  quite agreeable to having a pacemaker placed, although she is rather sleepy  now, and this should be discussed with her again when she is more awake.                                               [name], M.D.    DDG/MEDQ  D:  12/26/2002  T:   12/26/2002  Job:  [id number]   cc:   [name]. Eloise Harman., M.D.  [address]  [place]  [place] [address]  Fax: [phone]   [name], M.D.  [address]  [place]  [place] [address]  Fax: [phone]   Aram Candela. Aleen Campi, M.D.  [address]  [place]  [place] [address]  Fax: [phone]

## 2010-07-30 NOTE — H&P (Signed)
NAME:  [name], [name] NO.:  [id number]   MEDICAL RECORD NO.:  [id number]                   PATIENT TYPE:  EMS   LOCATION:  ED                                   FACILITY:  [facility]   PHYSICIAN:  Alfonse Spruce, M.D.               DATE OF BIRTH:  1923-07-29   DATE OF ADMISSION:  01/07/2003  DATE OF DISCHARGE:                                HISTORY & PHYSICAL   CHIEF COMPLAINT:  The patient rode by ambulance from Dr. Pincus Sanes office,  sent by Vangie Bicker, the P.A. of Dr. [name] with the chief  complaint, she had vomited blood and had blood in the stool three times,  associated with weakness.   HISTORY OF PRESENT ILLNESS:  The patient is a 77 year old white female who  woke up this morning with the experience that she had to go to the bathroom  in her routine, and found black stools in the toilet bowel.  It happened  that she felt queasy and crampy, and went again, and black stool again x3.  However, at 6 o'clock, she ate liquid Slim, and subsequently, she also had  vomiting and vomited three times, and there was fresh red blood.  Subsequently, as she stated, if this happened again, she would call the  doctor.  She called her doctor who told her to go and see him.  She saw the  PA of Dr. [name], Vangie Bicker.  She called me, and transferred the  patient by ambulance to the emergency room at [facility] where she  mostly comes here first.   PAST MEDICAL HISTORY:  She had a history of cardiac catheterization by Dr.  Aram Candela. Tysinger and underwent stent of the coronary vessel.  It was  questionable which side at this time, no available record in the emergency  room.  Her cardiologist was Dr. Elsie Lincoln.  She also underwent laparoscopic  cholecystectomy, and the laparoscopic cholecystectomy was done 10 days ago  by the surgeon, Dr. Roseanne Reno.   PHYSICIANS:  Her PCP is Dr. Dimas Alexandria.  The surgeon is Dr. Roseanne Reno.  The  cardiologist is Dr. Elsie Lincoln.  The cardiac catheterization done by Dr.  Aleen Campi.   Also in the office of Dr. [name], she was hypotensive by the PA.  In the  emergency room, she was in the range of 129 to 109 systolic.  However, she  was hypotensive at 90 systolic in the office.   MEDICATIONS:  Her list of medications that she was on:  1. Metoprolol twice a day.  2. Plavix 75 mg once a day.  3. She was on aspirin 81 mg daily.  4. Vitamin C.  5. She was also on Benicar one-half tablet, once a day.  We are not able to     obtain the milligrams.   ALLERGIES:  Lescol which caused muscle aching in the shoulder, arm and the  legs.   PAST MEDICAL HISTORY:  She has a mild history of borderline diabetes, but  she has not taken any medication for it, only diet.  She has a myocardial  infarction in July, and she has a history of cardiac catheterization  subsequently, as well as probable anterior wall, treated with a stent.  She  also underwent cholecystectomy 10 days ago by Dr. _____________.   FAMILY HISTORY:  She is gravida 2, para 2.  Mother died at 76.  She had  gallbladder surgery, and died of old age.  Her father was 35 and died of old  age.  No siblings.  She lived with her husband who is in the [facility]  and who has already a stroke, and she was taking care of him.   PAST MEDICAL HISTORY:  She denied any previous history of upper GI bleeding  or lower GI bleeding or ulcer disease in the past.   REVIEW OF SYSTEMS:  Neuropsychiatry:  She had no history of depression, no  history of stroke.  Musculoskeletal:  She had degenerative osteoarthritis of  the knee and hand.  GI:  Associated with GI bleeding, upper and lower, with  the passing of black maroon from the rectum, and vomited fresh blood from  her stomach with achy type of stomach.  No previous history of similar  attack; however, she was using Plavix and aspirin.  Cardiovascular:  She had  a history of myocardial infarction  as mentioned before, and stent placement  with cardiac catheterization.  She current has no chest pain as she stated.  Respiratory:  No shortness of breath, no recent congestive heart failure,  and no orthopnea or paroxysmal nocturnal dyspnea.  GU:  No dysuria or  hematuria.  Endocrine:  She had mild diabetes, but no medication for it, and  no history of thyroid disease.  The rest of the review of systems was  negative.   PHYSICAL EXAMINATION:  The patient is conscious, alert, very-pleasant,  elderly lady.   EKG showed normal sinus rhythm.  She had a Q wave, however, in lead 2 to 3  and aVF with old anterior wall myocardial infarction which was probably old.   She also had a lab in the emergency room which indicated white count 16.4  with leukocytosis.  She had hemoglobin of 33, and MCV of 84.5 with  hemoglobin 11.3 with hematocrit 33.  Her platelet count is 405.  Her  electrolytes indicates sodium 138, potassium 4.6, chloride 105, carbon  dioxide 26, and glucose 179.  BUN 65.  Creatinine 1.2.  Calcium of 9.1 which  is indicating the presence of blood in the gut.  Pro-time was 13.7 with INR  1.1.  She does not use Coumadin.   VITAL SIGNS:  Blood pressure in the emergency room 109/85 the second  reading.  The first reading was 129/49.  Her pulse rate 80 and sinus rhythm,  85 beats per minute.  HEENT:  Head normocephalic, atraumatic.  Pupils equal and reactive.  Conjunctivae was pink.  Sclerae was nonicteric.  Oropharynx was negative  with caps and able to swallow normally, no facial asymmetry.  NECK:  Supple, no JVD, no thyromegaly, no lymphadenopathy, trachea midline.  CHEST:  Clear to auscultation with normal breath sounds, with mild  kyphoscoliosis.  LUNGS:  Aerated bilaterally.  HEART:  PMI in the fifth intercostal space, mildly outside the midclavicular  line.  She has a systolic murmur, grade 1-2/6. ABDOMEN:  Soft, positive bowel  sounds.  There is tenderness on the left   descending colon and the sigmoid.  However, there is no rebound.  RECTAL:  Examination showed grossly black maroon stools which is dried blood  also on her genital area.  EXTREMITIES:  She has degenerative osteoarthritis of the knee and the foot  and ankle.  PULSE:  Distal pulses bilaterally 2+/2+, and deep tendon reflexes 1+/2+  bilateral and symmetrical.  NEUROLOGIC:  On examination, Cranial nerves II-XII were intact.  Motor and  sensory was intact.  However, she is weak and could not stand.   IMPRESSION:  1. Acute gastrointestinal bleed.  2. Anemia secondary to the Gastrointestinal bleeding.  Etiology probably     aspirin and Plavix.  3. Rule out peptic ulcer disease.  4. Rule out Helicobacter pylori.  5. Underlying his of coronary artery disease, history of myocardial     infarction, probably inferior wall and status post stent placement in     July by Dr. Aram Candela. Tysinger.  6. Underlying degenerative osteoarthritis.  7. Leukocytosis, probably secondary to the stress of the ulcer.     Diverticular bleed consider; however, not highly on the list.   PLAN:  1. The patient is advised to be N.P.O. at this time.  2. We will be consulting GI, gastroenterology, Dr. Leone Payor as well.  3. We will also be starting IV fluids with 0.9 normal saline.  4. She had mild renal function impairment due to the prerenal and due to the     blood in the gut which absorbed and increased the BUN up.  5. She will be monitored on telemetry and hemoglobin and hematocrit.  6. We will also monitor the blood sugar.  However, we will be watching the     blood sugar with the CBG to scale as well as we will be monitoring the     general condition with the hemoglobin and hematocrit q.6h.  7. Type and cross match for two units was also obtained.                                                Alfonse Spruce, M.D.    Wynn Maudlin  D:  01/07/2003  T:  01/07/2003  Job:  [id number]   cc:   [name]. Eloise Harman.,  M.D.  [address] [place] [address]  [place]  [place] [address]  Fax: [phone]   Aram Candela. Aleen Campi, M.D.  [address]  [place]  [place] [address]  Fax: [phone]   Madaline Savage, M.D.  [phone] [address], [address]  [place]  [place] [address]  Fax: [phone]   Iva Boop, M.D. [facility]

## 2010-07-30 NOTE — Op Note (Signed)
[name], [name]                         ACCOUNT NO.:  [id number]   MEDICAL RECORD NO.:  [id number]                   PATIENT TYPE:  INP   LOCATION:  0470                                 FACILITY:  [facility]   PHYSICIAN:  [name], M.D.           DATE OF BIRTH:  09/22/1923   DATE OF PROCEDURE:  12/26/2002  DATE OF DISCHARGE:                                 OPERATIVE REPORT   PREOPERATIVE DIAGNOSIS:  Chronic calculous cholecystitis with biliary colic.   POSTOPERATIVE DIAGNOSIS:  Chronic calculous cholecystitis with biliary  colic.   OPERATION:  Laparoscopic cholecystectomy.   SURGEON:  [name], M.D.   ASSISTANT:  Sheppard Plumber. Earlene Plater, M.D.   ANESTHESIA:  General endotracheal.   CLINICAL HISTORY:  This patient presented yesterday with a three day history  of nausea, vomiting and abdominal pain which had begun in the left mid  abdomen but then was localized to the right upper quadrant. She was noted to  have gallstones. She had a history of coronary disease status post stent but  was stable cardiac wise.   DESCRIPTION OF PROCEDURE:  The patient was seen in the holding area and had  no further questions. She was taken to the operating room and after  satisfactory general endotracheal anesthesia had been obtained, the abdomen  was prepped and draped. 0.25% plain Marcaine was injected at the umbilical  incision and fascia opened, peritoneal cavity entered under direct vision,  pursestring placed and the Hasson introduced. We insufflated the abdomen,  placed a camera and saw that we had some omental adhesions that we were not  through so we were underneath the colon so we removed the cannula and I  bluntly dissected a window to free up access into the peritoneal cavity,  reinserted the cannula and then placed the camera. The patient was placed in  reverse Trendelenburg and just after we placed the other three cannulas  under direct vision, the patient's marked  bradycardia. We deflated the  abdomen momentarily and the patient rebounded managed by anesthesia. After  discussion with anesthesia, they thought she was stable and we could proceed  with the surgery.   The gallbladder was retracted over the liver and multiple omental adhesions  taken down. We got into the triangle of Calot from the peritoneum and  identified the cystic duct and could identify this nice and clearly. A  couple of small branches of what appeared to be the cystic artery adjacent  was clipped and divided. Because of the patient's cardiac situation, we  elected not to do a cholangiogram since we thought we had the anatomy well  identified and she had normal liver functions. The cystic duct was clipped  and divided leaving several clips on the stay side. The gallbladder was  removed from below to above and once we freed it up a little bit more, we  then identified the main artery which was  triple clipped and divided and  then the gallbladder removed from below to above. Because the patient's been  on Plavix, we spent a little extra time making sure everything looked  completely dry. Once that was done, the gallbladder was brought out the  umbilical port. We insufflated again, checked for hemostasis and again,  checked for hemostasis and again everything appeared dry. The lateral ports  removed under direct vision. The  umbilical ports pursestring was tied down and that closed that. The abdomen  was deflated through the epigastric port. The skin was closed with 4-0  Monocryl subcuticular plus Dermabond. The patient tolerated the procedure  well. There were no operative complications and all counts were correct.                                               [name], M.D.    CJS/MEDQ  D:  12/26/2002  T:  12/26/2002  Job:  [id number]   cc:   [name]. Eloise Harman., M.D.  [address] [place] [address]  [place]  [place] [address]  Fax: [phone]

## 2010-07-30 NOTE — H&P (Signed)
NAME:  [name], [name] NO.:  [id number]   MEDICAL RECORD NO.:  [id number]                   PATIENT TYPE:  INP   LOCATION:  2923                                 FACILITY:  [facility]   PHYSICIAN:  Juline Patch, M.D.                  DATE OF BIRTH:  [birth year]   DATE OF ADMISSION:  09/22/2002  DATE OF DISCHARGE:                                HISTORY & PHYSICAL   HISTORY OF PRESENT ILLNESS:  This is a 77 year old white female patient of  Dr. [name] with a history of hypertension and hyperlipidemia who comes in  complaining of substernal chest pain radiating to his back and also down  both arms.  She stated that this happened yesterday evening, and has  progressed throughout.  She originally had this chest pain approximately  five days ago and saw her primary care physician.  She was put on antacid  pills that she does not remember the name of, but there was no relief.  She  denies any fevers, chills, shortness of breath, diaphoresis, nausea, or  vomiting.  She states the chest pain is a dull ache, but an 8-9/10 in  intensity that is intermittent.  She denies having any cardiac history,  except having a loop recorder done by Dr. Aleen Campi in the past.   PAST MEDICAL HISTORY:  1. Hyperlipidemia.  2. Hypertension.  3. History of gastroesophageal reflux disease on ranitidine p.r.n.   MEDICATIONS:  1. Lescol XL 80 mg daily.  2. Lotensin unknown dose.  3. Hydrochlorothiazide.   ALLERGIES:  NO KNOWN DRUG ALLERGIES.   SOCIAL HISTORY:  She does not smoke or drink alcohol.  She lives along.  She  smoked in the remote past.   FAMILY HISTORY:  No history of cardiac disease.  Negative for diabetes or  cancer.   REVIEW OF SYSTEMS:  Denies any rash, headache, blurry vision that is new,  joint pain, blood in stools, black stools, cough, or change in weight.   PHYSICAL EXAMINATION:  GENERAL:  On examination in the emergency room, the  patient is complaining of  substernal pain, but not in distress.  She is  slightly sedated after being given morphine in the emergency room.  VITAL SIGNS:  Her blood pressure is in the 170's on the left, 140's on the  right.  Pulse is in the 80's.  NECK:  She has no JVD.  HEART:  Heart sounds S1 and S2.  ABDOMEN:  Soft, nontender, with no guarding or rigidity.  Negative Murphy's  sign.  No epigastric tenderness.  LUNGS:  Clear.  EXTREMITIES:  No edema.  She has no ventricular heaves.   LABORATORY DATA:  Potassium 3.0; otherwise all results are negative.   Her chest x-ray shows a tortuous aorta.   Her troponin lab work shows 0.15 with normal CK and MB.   EKG shows questionable Q wave in lead  III, and nonspecific ST-T changes in  lead III, but no overt ST abnormalities.  She is in sinus rhythm.   ASSESSMENT AND PLAN:  This is a 77 year old white female with a history of  hypertension, hyperlipidemia, and obesity, coming in complaining of four day  intermittent chest pain not relieved by antacids given to her by her primary  care physician.  She says that the pain it as times very severe, almost  9/10.  She was not given nitroglycerin in the emergency room.  We will put  her on a nitroglycerin patch, heparin, and monitor in the CCU.  Because of  the nature of the pain and the asymmetrical blood pressures, we will get a  CT of the chest to rule out dissection.  We will start her on beta blockers,  aspirin, and Lovenox.  Will monitor her serial CIP'S, continue __________.  We will consult Cardiology.  This was discussed with the patient and her  friend, and they are aware that we must rule out cardiac etiology first  before we consider this to gastrointestinal as her friend has implied.  If  her chest pain is not relieved with morphine and nitroglycerin, we will  consult cardiology for any urgent intervention.  CT scan is pending and will  be read stat.                                               Juline Patch, M.D.    RP/MEDQ  D:  09/22/2002  T:  09/22/2002  Job:  [id number]   cc:   [name]. Eloise Harman., M.D.  [address] [place] [address]  [place]  [place] [address]  Fax: [phone]   Aram Candela. Aleen Campi, M.D.  [address]  [place]  [place] [address]  Fax: [phone]

## 2010-07-30 NOTE — Procedures (Signed)
[place]  Patient:    [name], [name]                      MRN: [id number] Proc. Date: 09/20/99 Adm. Date:  [id number] Attending:  Silvestre Mesi CC:         [name]. Eloise Harman., M.D.             John R. Aleen Campi, M.D.             Cardiac Catheterization Laboratory                           Procedure Report  REFERRING PHYSICIAN:  [name]. Eloise Harman., M.D.  PROCEDURE:  Insertion of patient activated cardiac event recorder.  ANESTHESIA:  Local with 1% lidocaine.  INDICATIONS FOR PROCEDURE:  Syncope.  DESCRIPTION OF PROCEDURE:  After signing an informed consent, the patient was brought to the cardiac catheterization lab at [facility] where her left anterior chest was prepped and draped in a sterile fashion and after mapping an area for good recording in her left parasternal area, a small area was anesthetized locally with 1% lidocaine.  An approximate 2 cm incision was made in this anesthetized plane with this incision being deepened into the fascial layer overlying the pectoralis muscle.  A small pocket was made for insertion of the loop recorder.  After making this pocket and lavaging the pocket profusely with kanamycin solution, the loop recorder was inserted into this pocket and secured properly with 2-0 silk.  After properly securing the pulse generator the wound was closed in layers using 2-0 Dexon.  Final skin closure was obtained with a cutaneous layer of Steri-Strips.  The patient tolerated the procedure well and no complications were noted.  At the end of the procedure a sterile bulky dressing was applied to the wound and she was returned to the short-stay unit prior to anticipated discharge in approximately one to two hours.  We will also plan to analyze prior to discharge. DD:  09/20/99 TD:  09/20/99 Job: [id number]

## 2010-07-30 NOTE — Consult Note (Signed)
NAME:  [name], [name] NO.:  [id number]   MEDICAL RECORD NO.:  [id number]                   PATIENT TYPE:  EMS   LOCATION:  ED                                   FACILITY:  [facility]   PHYSICIAN:  Iva Boop, M.D. [facility]           DATE OF BIRTH:  1923-06-19   DATE OF CONSULTATION:  01/07/2003  DATE OF DISCHARGE:                                   CONSULTATION   REASON FOR CONSULTATION:  GI bleeding.   HISTORY:  This is a pleasant 77 year old white woman who has been on aspirin  and Plavix and aspirin since she had a cardiac catheterization and right  stent deployment by Dr. Elsie Lincoln in July.  Dr. Aleen Campi is her regular  cardiologist.  She had done reasonably well, but then had a laparoscopic  cholecystectomy on December 26, 2002 because of nausea, vomiting, abdominal  pain, and gallstones.  She underwent that without difficulty, as best I can  tell.  Then, over the past couple of days, it sounds like she has had some  melena, and then has had some vomiting, and then has vomited some dark  coffee ground material and some bright red blood.  She had some red blood  streaked in her stool, as well.  She had presyncope earlier today.  She  feels ill, but in no acute distress at this time.  She denies any particular  abdominal pain.  There is some chronic intermittent dysphagia.  She has  regular bowel movements without difficulty.  Family history is  noncontributory with no gastrointestinal problems, as best as I can tell.  She has been relatively stable, as far as hemodynamics since coming to the  emergency room.  She is not tachycardic, but she is on beta blocker.   MEDICATIONS:  1. Aspirin.  2. Plavix.  3. Lopressor.  4. Benicar.  5. Nu-Iron.  6. Ranitidine.  7. Mevacor, I believe.   PAST MEDICAL HISTORY:  As above.  In addition, she has diet-controlled  diabetes, obesity, dyslipidemia, hypertension, and a history of reflux  disease.  She does take a  ranitidine at bedtime.  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   SOCIAL HISTORY:  Married for 57 years.  Remote tobacco.  No alcohol.   REVIEW OF SYSTEMS:  Denies any angina.  No respiratory difficulty.  She is  able to walk without too much problem.  No urinary difficulty.  Mild cough  recent.  Wears eyeglasses.  All other systems appear negative at this time.   PHYSICAL EXAMINATION:  GENERAL:  Well-developed, elderly, obese, woman in no  acute distress.  VITAL SIGNS:  Blood pressure 120/50, pulse 80-100, respirations 18.  She is  afebrile.  HEENT:  The eyes are anicteric.  Mouth free of lesions.  NECK:  Supple.  CHEST:  Clear.  HEART:  S1 and S2.  No rubs or gallops.  ABDOMEN:  Large,  soft, nontender.  No organomegaly or mass.  She has some  ecchymoses around her laparoscopic cholecystectomy scars.  RECTAL:  She has had melenic stool here and on previous exam.  We did not  repeat the rectal exam.  EXTREMITIES:  Good distal pulses.  Warm and dry.  No clubbing, cyanosis, or  edema.  NEUROLOGIC:  She is alert and oriented x3.  LYMPH NODES:  No neck or supraclavicular nodes.  SKIN:  Without acute rash.   LABORATORY DATA:  Her hemoglobin is 11.3, hematocrit 33, platelets 405.  Coags normal.  Electrolytes normal.  Glucose 160, BUN 65, creatinine 1.2.   ASSESSMENT:  Acute upper gastrointestinal bleeding, likely an ulcer, perhaps  from aspirin.  She had presyncope today.  There is some chronic intermittent  dysphagia, as well.   RECOMMENDATIONS AND PLAN:  Admit to ICU or step-down.  Volume replete. Will  go ahead and transfuse one units of packed red cells.  IV Protonix drip to  keep pH greater than 6 and increase the chance of providing clot, and plan  for elective endoscopy tomorrow.  Should she develop instability, urgent  endoscopy could be done, but I think she needs fluid and blood resuscitation  at this time.  We will hold her aspirin and Plavix.  Further plans pending   clinical course.  I have explained the risks, benefits, and indications of  upper GI endoscopy to the patient and her son, and have discussed the case  with Dr. Mal Amabile.   I appreciate the opportunity to care for this patient.                                               Iva Boop, M.D. [facility]    CEG/MEDQ  D:  01/07/2003  T:  01/07/2003  Job:  [id number]   cc:   [name]. Eloise Harman., M.D.  [address]  [place]  [place] [address]  Fax: [phone]   Alfonse Spruce, M.D.   John R. Aleen Campi, M.D.  [address]  [place]  [place] [address]  Fax: [phone]

## 2010-07-30 NOTE — Discharge Summary (Signed)
[name], [name]                         ACCOUNT NO.:  [id number]   MEDICAL RECORD NO.:  [id number]                   PATIENT TYPE:  INP   LOCATION:  3739                                 FACILITY:  [facility]   PHYSICIAN:  [name]. Eloise Harman., M.D.       DATE OF BIRTH:  03-Aug-1923   DATE OF ADMISSION:  09/26/2002  DATE OF DISCHARGE:  09/27/2002                                 DISCHARGE SUMMARY   FINDINGS:  1. Acute labyrinthitis.  2. Recent acute myocardial infarction.  3. Recent acute cardiac catheterization with percutaneous transluminal     coronary angioplasty and stenting of a right coronary lesion, the     __________ lesion for the infarct.  4. Mild diabetes mellitus, diet controlled.  5. Acute atrial fibrillation on previous admission.   HISTORY OF PRESENT ILLNESS:  This 77 year old white female was discharged on  September 25, 2002 following acute myocardial infarction with intervention  including PTCA and stenting.  She was doing well at the time of discharge.  On the morning of presentation on this admission, she got up to go to the  bathroom and became violently vertiginous, sweaty.  It was unclear the  etiology given the previous circumstances.  Because of this, she was brought  to the emergency room by EMS and evaluated, subsequently admitted for  observation.   HOSPITAL COURSE:  The patient was placed on a monitor floor for 23 hours  observation.  She had no complications during her stay.  She had rapid  resolution of her vertiginous symptoms, had no chest pain, diaphoresis or  shortness of breath.  She had laboratory studies that did not show any new  cardiac injuries.   LABORATORY DATA:  Laboratory data showed yellow hazy urine, specific gravity  1.021, pH 7, 100% of glucose, small amount of hemoglobin, 15 mg/% ketones,  small amount of leukocyte esterase, 3-6 rbc's, 36 wbc's per HPF, rare  bacteria.  Her total CK was 97 with an MB of 3.3.  Her comprehensive  metabolic panel was benign.  Glucose was elevated at 263, albumin slightly  low at 3.1, total bilirubin slightly elevated at 2.2.  Troponin was elevated  at 1.08 but this was down dramatically from previous troponins during her  infarct.  A CBC showed a white count 7600, hemoglobin 10, hematocrit 28, MCV  87.9, platelet count 225,000.  Her EKG currently shows normal sinus rhythm.  Q waves inferiorly compatible with her previous infarct.  Slow R wave  progression across the anterior precordium, possibly due to lead placement  or old anterior infarct.   CONSULTATIONS:  None.   OPERATIONS:  None.   TRANSFUSIONS:  None.   NOSOCOMIAL INFECTIONS:  None.   PLAN:  The patient was advised that she has had no evidence of any recurring  cardiac events, especially arrhythmias which were monitored closely during  this observation.  She would continue on her previous medications as  previously outlined  except to reduce her K-Dur down to 20 mEq twice a day  for five days, then once  daily until her office.  To see me in three weeks, Dr. Charolette Child, her  cardiologist, in 10-14 days.  She is to follow a low-salt, no concentrated-  sugar diet, light activity around the house.   CONDITION ON DISCHARGE:  Improved.                                               [name]. Eloise Harman., M.D.    RCS/MEDQ  D:  09/27/2002  T:  09/28/2002  Job:  [id number]

## 2010-07-30 NOTE — Discharge Summary (Signed)
NAME:  [name], [name]                         ACCOUNT NO.:  [id number]   MEDICAL RECORD NO.:  [id number]                   PATIENT TYPE:  INP   LOCATION:  2015                                 FACILITY:  [facility]   PHYSICIAN:  [name]. Eloise Harman., M.D.       DATE OF BIRTH:  [birth year]   DATE OF ADMISSION:  09/22/2002  DATE OF DISCHARGE:  09/25/2002                                 DISCHARGE SUMMARY   FINAL DIAGNOSES:  1. Acute myocardial infarction due to acute occlusion of mid right coronary     artery.  2. History of gastroesophageal reflux disease.  3. Hyperlipidemia.  4. Hypertension.  5. Hypokalemia.  6. Hypercholesterolemia.  7. Pyuria without evidence of bacteriuria on presenting urinalysis.   HISTORY OF PRESENT ILLNESS:  This 77 year old white female who presented  with progressive substernal chest pain radiating to her back and down both  arms, starting the evening prior to admission. She had had some intermittent  ill defined mid chest pain and mid back pain for about five days prior to  this presentation, but it was certainly not of any serious nature. She has  been seen in the office, and despite careful questioning and a normal EKG,  it was determined that it was more likely symptoms related to esophageal  spasm. In retrospect, it may well have been intermittent coronary ischemic  episodes. Because of the acute nature, the patient was admitted for further  evaluation by my associate, Dr. Juline Patch, in my absence.   HOSPITAL COURSE:  The patient was placed in the intensive care unit. She was  seen urgently by cardiologist on call, Dr. Chanda Busing. He evaluated the  patient and noted the new inferior ST changes on her EKG and elevated CK and  troponin up to 0.67 and concurred with the need for urgent intervention. The  patient underwent emergency coronary angiography, noting essentially no flow  through a 99+ percent lesion in her right coronary artery. She had  some  additional lesions in branch vessel on her circumflex as well. She had  hypokinetic area in the distribution of the obstructive lesion, but ejection  fraction relatively preserved at 55%. She had an emergency angioplasty and  had placement of a Cypher stent 3 x 5 x 13 mm with successful restoration of  flow. Subsequently, she did extremely well, felt well, felt stronger. She  was transferred out of the coronary unit on the 2000 monitored floor,  monitored expectantly. She had gradual increase in ambulation and activity.  On the morning of discharge on getting up to the bathroom, she suddenly went  into atrial fibrillation with a rate of around 120 per minute. She was aware  of a little bit of palpation but had no chest pain, no shortness of breath,  otherwise tolerated this, and within an hour and a half to two hours, it had  completely resolved. The patient then had additional activity and was  to be  seen by the attending cardiologist, Dr. Charolette Child, prior to discharge.  At the time of this dictation, the patient was still in sinus rhythm,  anticipating discharge later this morning.   LABORATORY DATA:  The patient's CK was elevated on admission. The patient's  laboratory data on admission showed potassium slightly low at 3.0. This  subsequently came up to the range of 3.4 to 3.7. Her CBC showed an initial  white count of 11,500, hemoglobin 12.4, hematocrit 36%, platelet count  220,000. Electrolytes otherwise normal. __________  was elevated initially  at 187 and 209, BUN 10 and 14, creatinine 1.1 mg%. Liver functions:  AST was  slightly abnormal at 58, total bilirubin 1.3, indirect bilirubin 1.1, direct  bilirubin 10.2. CK appeared to have maxed out at 559 with 84.8 nanograms/mL  of MB and relative index of 15.2. Troponin was markedly elevated at 8.18,  and this gradually came back down to 4.4 with a CK down to 281 by the day  prior to discharge. Lipid panel taken in this crisis  state showed an  amazingly low cholesterol 144, triglycerides 210, HDL 36, LDL 66. It is to  be noted the patient was on Lescol prior to admission. EKGs taken serially  noted initial EKG with only very subtle elevation in inferior lead with a Q  wave in leads III and aVF. Limb leads were otherwise normal. A second EKG  taken at 17:57 hours on July 11 showed significant elevation of ST and  inferior leads compatible with an acute injury pattern.   CONSULTATIONS:  Dr. Chanda Busing for [facility] with  an emergency coronary angiography with stent placement. Subsequent cardiac  assessment and monitoring by attending cardiologist, Dr. Charolette Child, with  [facility].   RECOMMENDATIONS ON DISCHARGE:  The patient was advised that despite her  acute infarct and stent placement that she was doing quite well. We  discussed the atrial fibrillation and the significance thereof. We told her  that she should stay on low dose aspirin in the form of Ecotrin 81 mg and  Plavix 75 mg until further notice. She would stay on Lopressor at 25 mg  twice daily and Cardizem or equivalent 60 mg every eight hours until further  notice. She would stay on Micro-K or K-Dur 20 mEq three times daily for the  first week then twice daily thereafter. She did not require resumption of  any H2 blockers or PPIs at this time. I have asked her to check with her  nursing staff daily for monitoring of her vital signs. She resides at  [facility] and has access to a golf cart to her meals. She feels that she  can easily do this and should not have any problems. She absolutely declines  consideration for a higher level of care, i.e. assisted living or skilled  care and feels that she can easily remain in her independent living  scenario. She will Dr. Aleen Campi, cardiologist, again in about 10 to 14 days.  See  me again in three to four weeks. On her visit in three weeks, she will need a  BMET to follow up her potassium level. She will need a followup urine  to determine the significance of the pyuria noted on admission. The patient  will call for any usual problems with chest pain or palpitations. We  anticipate a smooth recovery from her acute infarct.  [name]. Eloise Harman., M.D.    RCS/MEDQ  D:  09/25/2002  T:  09/25/2002  Job:  [id number]

## 2010-07-30 NOTE — Op Note (Signed)
NAMESHEALEE, YORDY               ACCOUNT NO.:  [id number]   MEDICAL RECORD NO.:  [id number]          PATIENT TYPE:  AMB   LOCATION:  DSC                          FACILITY:  [facility]   PHYSICIAN:  [name], M.D.DATE OF BIRTH:  [birth year]   DATE OF PROCEDURE:  06/29/2006  DATE OF DISCHARGE:                               OPERATIVE REPORT   PREOPERATIVE DIAGNOSIS:  Unneeded Port-A-Cath.   POSTOPERATIVE DIAGNOSIS:  Unneeded Port-A-Cath.   OPERATION:  Removal Port-A-Cath.   SURGEON:  [name], M.D.   ANESTHESIA:  Local.   CLINICAL HISTORY:  Ms. Abrams has completed her chemotherapy and her  radiation and wished to have her port removed.   DESCRIPTION OF PROCEDURE:  The patient was seen in the minor procedure  room, and we confirmed Port-A-Cath removal as the planned procedure.  The Port-A-Cath site was identified and prepped with some Betadine and  anesthetized with 1% Xylocaine with epinephrine.  I waited about 10  minutes for good effect of the epinephrine and then prepped it again  with Betadine and draped it as a sterile field.   The old incision was utilized.  The subcutaneous tissue was entered and  the port identified in its pocket.  It was sharply excised.  Prior to  backing the tubing out, I put a figure-of-eight suture to prevent  backbleeding.   Once the port was removed, everything appeared to be dry.  The incision  was closed in layers with 3-0 Vicryl subcutaneously plus some Dermabond  on the skin.   The patient tolerated the procedure well.  There no complications.      [name], M.D.  Electronically Signed     CJS/MEDQ  D:  06/29/2006  T:  06/29/2006  Job:  [id number]

## 2010-07-30 NOTE — Cardiovascular Report (Signed)
NAME:  [name], [name]                         ACCOUNT NO.:  [id number]   MEDICAL RECORD NO.:  [id number]                   PATIENT TYPE:  INP   LOCATION:  6526                                 FACILITY:  [facility]   PHYSICIAN:  John R. Tysinger, M.D.              DATE OF BIRTH:  1923-07-17   DATE OF PROCEDURE:  10/20/2003  DATE OF DISCHARGE:                              CARDIAC CATHETERIZATION   REFERRING PHYSICIAN:  [name]. [name], M.D.   PROCEDURE:  1. Left heart catheterization.  2. Coronary cineangiography.  3. Left internal mammary artery cineangiography.  4. Left ventricular cineangiography.  5. Abdominal aortogram.  6. Angioplasty with primary stenting of the proximal right coronary artery     lesion.  7. Angiocele of the right femoral artery.   INDICATIONS FOR PROCEDURE:  This 77 year old female was admitted on October 17, 2003 with unstable angina.  She has a history of coronary artery disease  and is status post angioplasty with a drug-eluting stent on September 22, 2002  which was inserted on an emergency basis.  She has a history of diabetes and  hypertension.  She was noted to have further disease in her coronary  arteries with a severe stenosis in her circumflex and a moderate stenosis in  her left anterior descending and plans were made for staging these lesions.  However, she remained stable clinically since last July.  She now returns in  an unstable condition and was scheduled for repeat catheterization and  possible angioplasty.   DESCRIPTION OF PROCEDURE:  After signing an informed consent, the patient  was premedicated with 5 mg of Valium by mouth and brought to the cardiac  catheterization lab at [facility].  Her right groin was prepped and  draped in the sterile fashion and anesthetized with 1% lidocaine.  A 6-  French introducer sheath was inserted percutaneously into the right femoral  artery.  A 6-French #4 Judkins coronary catheters were used to  make  injections into the native coronary arteries.  The right coronary catheter  was used to make injections into the left subclavian artery to visualize the  left internal mammary artery.  A 6-French pigtail catheter was used to  measure pressures in the left ventricle and aorta and to make mid stream  injections into the left ventricle and abdominal aorta. After noting a new  lesion in her proximal right coronary artery which was proximal to her stent  placed in July 2004, we reviewed this with Dr. Jenne Campus and he recommended  stenting of this new lesion and staging angioplasty of the LAD and possible  circumflex.  We then selected a 6-French JR-4 guide catheter which was  advanced to the root of the aorta.  The tip was engaged in the ostium of the  right coronary artery and a short HTF guide wire was advanced through the  guide catheter into the right coronary artery.  After sizing the vessel and  lesion, we selected a Cypher drug-eluting stent, size 3.5 x 18 mm and after  proper preparation, this was inserted over the guidewire and positioned  within the lesion.  Two inflations were made, the first at 18 atmospheres  for 30 seconds and the second at 22 atmospheres for 20 seconds.  After the  deployment, inflations were made.  The deployment balloon was removed and  injections again in the right coronary artery showed an excellent  angiographic result without residual stenosis and without evidence for  dissection or clot.  There was normal antegrade flow increased from TIMI 2  to a TIMI 3.  Following the angioplastic procedure, the catheter and sheath  were removed from the right femoral artery and hemostasis was easily  obtained with angiocele closure system.   MEDICATIONS GIVEN:  1. Heparin 4000 units IV.  2. Integrilin drip per pharmacy protocol.  3. Metoprolol 5 mg IV x3.  4. Nitroglycerin intracoronary 200 units in the right coronary artery.   CINE FINDINGS:  1. Coronary  angiography:  Left coronary artery, the ostium and left main     appeared normal.  The left anterior descending showed the proximal LAD     has an eccentric plaque causing a 70% stenosis.  There is good antegrade     flow.  The anterolateral and septal branches appear normal.  Circumflex     coronary artery:  The circumflex has a plaque in the proximal segment     which causes a 30% stenosis.  The middle segment has a bifurcation with a     small branch distally in the AV groove and a primary continuation is a     large obtuse marginal branch.  There is a concentric 60-70% stenosis in     the middle segment of the circumflex.  There is good antegrade flow and     good distal run-off.  The left internal mammary artery appears normal.   1. Left ventricular cineangiogram:  The left ventricular chamber size and     contractility appeared normal.  Ejection fraction was estimated as 70%.     The mitral and aortic valves appear normal.  The thoracic aorta is mildly     tortuous.   1. Abdominal aortogram:  The abdominal aorta is normal in appearance without     plaque.  The renal arteries are normal.  The common iliac arteries appear     normal.   1. Angioplasty cines:  Cines taken during the angioplasty procedure showed     proper positioning of the guidewire and balloon catheter.  The stent was     deployed just proximal to the prior stent placed on September 22, 2002 and a     good balloon form was obtained.  Final injections in the right coronary     artery showed an excellent angiographic result with 0% residual lesion,     good apposition of the new  stent to the prior stent, and normal     antegrade flow.  There was no dissection or clot.  There was normal TIMI     3 antegrade flow with normal distal runoff.   FINAL DIAGNOSES:  1. New critical stenosis of the proximal right coronary artery, proximal to     her prior stent placed on September 22, 2002. 2. Proximal left anterior descending  stenosis, 70%, unchanged from prior     study.  3. Mid circumflex stenosis, 60%, improved since  prior study.  4. Normal left internal mammary artery.  5. Normal left ventricular function.  6. Normal abdominal aorta and renal arteries.  7. Successful primary stenting with drug-eluting stent of the new proximal     right coronary artery lesion.  8. Successful angiocele of the right femoral artery.   DISPOSITION:  Will monitor on EAD and continue the Integrilin drip for 18  hours.  Will discuss further concerning staging her left anterior descending  lesion and possibly get a Persantine Cardiolite study in two to three weeks  to document reversible ischemia if it is present in that distribution.  If  reversible ischemia is not demonstrated, we will continue to treat her  medically.                                               John R. Aleen Campi, M.D.    JRT/MEDQ  D:  10/20/2003  T:  10/21/2003  Job:  [id number]   cc:   [name]. Eloise Harman., M.D.  [address]  [place]  [place] [address]  Fax: [phone]

## 2010-07-30 NOTE — Op Note (Signed)
[name], AYO               ACCOUNT NO.:  [id number]   MEDICAL RECORD NO.:  [id number]          PATIENT TYPE:  AMB   LOCATION:  DSC                          FACILITY:  [facility]   PHYSICIAN:  Ollen Gross. Vernell Morgans, M.D. DATE OF BIRTH:  [birth year]   DATE OF PROCEDURE:  DATE OF DISCHARGE:                                 OPERATIVE REPORT   PREOPERATIVE DIAGNOSIS:  Left breast cancer.   POSTOP DIAGNOSIS:  Left breast cancer.   PROCEDURE:  Placement of right subclavian vein Port-A-Cath.   SURGEON:  Ollen Gross. Carolynne Edouard, M.D.   ANESTHESIA:  Local with IV sedation.   DESCRIPTION OF PROCEDURE:  After informed consent was obtained, the patient  was brought to the operating room placed in the supine position on the  operating table. A roll was placed behind her shoulders and her right arm  tucked.  After adequate IV sedation, the patient's right chest and neck area  was prepped with Betadine and draped in usual sterile manner. The area just  lateral to the bend of the clavicle was infiltrated 1% lidocaine.  A small  incision was made into the edge of the bend of the clavicle.   The patient was placed in Trendelenburg position, and then a large bore-  finder needle from the Port-A-Cath kit was used to slide beneath the bend of  the clavicle and access the right subclavian vein. This was done without  difficulty.  A wire was then fed through the needle using the Seldinger  technique and into the central venous system. Placement of the wire in the  central venous system was confirmed by fluoroscopy. The incision was then  carried out a little bit more lateral and a subcutaneous pocket was created  by a combination of blunt finger dissection and sharp dissection with the  electrocautery. Once this was accomplished, the tubing was attached to the  well of the port.  The port was placed in the pocket and the length of the  tubing was estimated using fluoroscopy.   Next the sheath and dilator were  placed over the wire, again, using the  Seldinger technique without difficulty. The wire and dilator were then  removed from the patient and the thumb was placed over the opening of the  sheath.  The tubing of the catheter was then fed through the sheath without  difficulty; and the sheath was then cracked and gently separated keeping the  tubing in place. Once this was accomplished, a fluoroscopic image of the tip  of the catheter was obtained and the radiologist read this as just barely  being in the tip of the right atrium.  At this point a hemostat was used to  clamp the tubing near the well of the port. The tubing was then detached and  about a centimeter of the tip of the tubing was removed and the tubing was  then reattached to the well of the port and anchored permanently.  The port  was then placed back in the subcutaneous pocket and anchored in place with 2  interrupted 2-0 Prolene stitches.  The port was then aspirated and aspirated easily and then flushed with  dilute heparin solution.  The incision was then closed with a deep layer of  interrupted 3-0 Vicryl stitches; and the skin was closed with a running for  Monocryl subcuticular stitch. Benzoin and Steri-Strips were applied. The  port was then accessed and continued to aspirate and  flush well. The port was flushed with a concentrated heparin solution and  the tubing was left attached and sterile dressings were applied. The patient  tolerated the procedure well.  At the endo the case all needle, sponge, and  instrument counts were correct. The patient was then awakened and taken  recovery in stable condition.       PST/MEDQ  D:  09/15/2004  T:  09/15/2004  Job:  [id number]

## 2010-07-30 NOTE — H&P (Signed)
NAME:  [name], [name]                         ACCOUNT NO.:  [id number]   MEDICAL RECORD NO.:  [id number]                   PATIENT TYPE:  EMS   LOCATION:  ED                                   FACILITY:  [facility]   PHYSICIAN:  [name], M.D.           DATE OF BIRTH:  [birth year]   DATE OF ADMISSION:  12/25/2002  DATE OF DISCHARGE:                                HISTORY & PHYSICAL   CHIEF COMPLAINT:  Abdominal pain, nausea, and vomiting.   CLINICAL HISTORY:  This is a 77 year old lady who was in her usual state of  good health until about two days ago when she developed nausea and vomiting.  She saw a little bit of blood when she was throwing up.  She has also had  some pain in the right mid and upper abdomen, although initially she had a  little bit of left upper quadrant pain.  She thinks this all started after  eating some [employer].  She really has not been able to get much down  over the last two days and felt initially that she had a few chills.  She  saw Dr. [name] today who did notice that she has tenderness in the  epigastrium and right upper quadrant.  The white count at his office was  11,000.  She is known to have a history of gallstones and he thought she was  developing acute cholecystitis.   The patient notes that she is not particularly uncomfortable currently.  The  nausea is currently better, but she has not had anything to eat or drink.  She has not had any further diarrhea.   PAST SURGICAL HISTORY:  1. She has had a coronary catheterization.  2. Many years ago she had a hysterectomy.   MEDICATIONS:  Plavix, Lopressor, and some vitamins.   ALLERGIES:  None known.   HABITS:  Smokes:  None.  Alcohol:  None.   SOCIAL HISTORY:  The patient is retired.  She lives in the [facility], but  is ambulatory and functional there.   REVIEW OF SYSTEMS:  HEENT:  Negative.  CHEST:  No cough or shortness of  breath.  HEART:  History of coronary artery  disease, currently stable.  She  has been doing rehabilitation with no troubles, no chest pains, etc.  ABDOMEN:  Negative, except for HPI.  She has had a few other similar  episodes to this current one, but they have been much milder and shorter.  EXTREMITIES:  Negative.   FAMILY HISTORY:  Interestingly enough, her mother had her gallbladder out  when she was 70.   PHYSICAL EXAMINATION:  GENERAL APPEARANCE:  The patient is alert, oriented,  and comfortable appearing.  VITAL SIGNS:  Temperature 97 degrees, pulse 60, respirations 18, blood  pressure 152/73.  HEENT:  Head:  Normocephalic.  Eyes:  Pupils equal, round, and regular.  Nonicteric.  EOMs intact.  Pharynx:  Normal.  Not dry mucous membranes.  NECK:  Supple.  There are no masses or thyromegaly.  LUNGS:  Normal respirations.  Clear to auscultation.  HEART:  Regular rhythm.  I hear no murmurs, rubs, or gallops.  Pulses intact  carotid and femoral.  ABDOMEN:  Soft and tender in the right upper quadrant, but not a lot of  guarding.  There are no masses or suggestion of peritoneal signs.  Bowel  sounds are present.  No hernias are noted.  EXTREMITIES:  No cyanosis or edema noted.  She has good range of motion.  No  other abnormalities.   LABORATORY DATA:  The white count was 11,000.  Liver functions are basically  normal.   IMPRESSION:  Developing acute cholecystitis in a patient with known  cholelithiasis.   PLAN:  I think we will need to go ahead with cholecystectomy which could be  done today if we can find OR time or tomorrow.  Because of her nausea and  vomiting, I think we will need to go ahead and get her hydrated with some IV  fluids some.  Will repeat some bleeding studies since she has been on  aspirin and Plavix.  However, I think we should be able to safely do a  laparoscopic procedure with her on these medications.                                               [name], M.D.    CJS/MEDQ  D:   12/25/2002  T:  12/25/2002  Job:  [id number]

## 2010-07-30 NOTE — Cardiovascular Report (Signed)
[place]  Patient:    [name], [name] Visit Number: [id number] MRN: [id number]          Service Type: CAT Location: [facility] 2859 01 Attending Physician:  Silvestre Mesi Dictated by:   Aram Candela Aleen Campi, M.D. Proc. Date: 04/02/01 Admit Date:  04/02/2001   CC:         Marcy Salvo C. Eloise Harman., M.D.             Cardiac Catheterization Lab, [facility]                        Cardiac Catheterization  PROCEDURE:  Removal of loop recorder.  CARDIOLOGIST:  Aram Candela. Aleen Campi, M.D.  ANESTHESIA:  Local with 1% lidocaine.  DESCRIPTION OF PROCEDURE:  After signing an informed consent, the patient was premedicated with Benadryl 50 mg IV and brought to the cardiac catheterization lab.  Her left anterior chest was prepped and draped in the usual sterile fashion, and a left transverse parasternal area was anesthetized locally overlying the loop recorder.  An incision was made in this anesthetized plane with the incision being deepened into the fibrous layer overlying the loop recorder.  This fibrous layer was incised, exposing the loop recorder which was then removed from the pocket.  The wound was lavaged profusely with a kanamycin solution, and the wound was then closed in layers using 2-0 Dexon. Final skin closure was obtained with a continuous layer of Steri-Strips.  The patient tolerated the procedure well, and no complications were noted at the end of the procedure.  A sterile bulky dressing was applied to the wound, and she was returned to the short-stay unit for monitoring and one further dose of IV Ancef.  She was given wound care instructions and will be able to return home after the next dose of IV Ancef. Dictated by:   Aram Candela. Aleen Campi, M.D. Attending Physician:  Silvestre Mesi DD:  04/02/01 TD:  04/02/01 Job: [id number]

## 2010-08-20 ENCOUNTER — Other Ambulatory Visit: Payer: Self-pay | Admitting: Geriatric Medicine

## 2010-08-20 DIAGNOSIS — Z853 Personal history of malignant neoplasm of breast: Secondary | ICD-10-CM

## 2010-08-20 DIAGNOSIS — Z9889 Other specified postprocedural states: Secondary | ICD-10-CM

## 2010-12-10 LAB — URINE MICROSCOPIC-ADD ON

## 2010-12-10 LAB — COMPREHENSIVE METABOLIC PANEL
ALT: 301 — ABNORMAL HIGH
AST: 66 — ABNORMAL HIGH
Albumin: 2.9 — ABNORMAL LOW
Alkaline Phosphatase: 116
Alkaline Phosphatase: 92
BUN: 14
CO2: 24
Calcium: 9
Chloride: 107
Creatinine, Ser: 0.85
GFR calc Af Amer: >60
GFR calc non Af Amer: >60
Glucose, Bld: 108 — ABNORMAL HIGH
Glucose, Bld: 145 — ABNORMAL HIGH
Potassium: 3.8
Potassium: 4.2
Sodium: 139
Total Bilirubin: 1.6 — ABNORMAL HIGH
Total Protein: 5.4 — ABNORMAL LOW
Total Protein: 5.8 — ABNORMAL LOW
Total Protein: 6.7

## 2010-12-10 LAB — URINALYSIS, ROUTINE W REFLEX MICROSCOPIC
Nitrite: POSITIVE — AB
Specific Gravity, Urine: 1.024
Urobilinogen, UA: 0.2
pH: 5

## 2010-12-10 LAB — CBC
HCT: 46.7 — ABNORMAL HIGH
Hemoglobin: 14.5
Hemoglobin: 15.3 — ABNORMAL HIGH
Hemoglobin: 15.7 — ABNORMAL HIGH
MCHC: 33.6
MCV: 90.7
RBC: 4.79
RBC: 5.03
RDW: 14.1
WBC: 6.2
WBC: 6.3

## 2010-12-10 LAB — BASIC METABOLIC PANEL
Chloride: 107
Creatinine, Ser: 0.81
GFR calc Af Amer: >60
Potassium: 4
Sodium: 142

## 2010-12-10 LAB — PROTIME-INR
INR: 2.6 — ABNORMAL HIGH
Prothrombin Time: 29.3 — ABNORMAL HIGH

## 2010-12-10 LAB — HEPATIC FUNCTION PANEL
ALT: 461 — ABNORMAL HIGH
ALT: 502 — ABNORMAL HIGH
Alkaline Phosphatase: 123 — ABNORMAL HIGH
Indirect Bilirubin: 2.1 — ABNORMAL HIGH
Indirect Bilirubin: 2.1 — ABNORMAL HIGH
Total Bilirubin: 3.2 — ABNORMAL HIGH
Total Protein: 6
Total Protein: 6.2

## 2010-12-10 LAB — CARDIAC PANEL(CRET KIN+CKTOT+MB+TROPI)
CK, MB: 2.7
CK, MB: 3.1
CK, MB: 3.1
Relative Index: INVALID
Total CK: 82
Total CK: 84
Troponin I: 0.02
Troponin I: 0.02

## 2010-12-10 LAB — HEPATITIS PANEL, ACUTE
Hep A IgM: NEGATIVE
Hep B C IgM: NEGATIVE

## 2010-12-10 LAB — LIPASE, BLOOD: Lipase: 43

## 2010-12-10 LAB — URINE CULTURE: Colony Count: >=100000

## 2010-12-10 LAB — DIFFERENTIAL
Lymphs Abs: 1.2
Monocytes Absolute: 0.7
Monocytes Relative: 8
Neutro Abs: 7
Neutrophils Relative %: 78 — ABNORMAL HIGH

## 2010-12-16 LAB — BASIC METABOLIC PANEL
BUN: 16 mg/dL (ref 6–23)
CO2: 25 mEq/L (ref 19–32)
Chloride: 104 mEq/L (ref 96–112)
GFR calc non Af Amer: >60 mL/min (ref >60)
Glucose, Bld: 136 mg/dL — ABNORMAL HIGH (ref 70–99)
Potassium: 4 mEq/L (ref 3.5–5.1)

## 2010-12-20 LAB — URINALYSIS, ROUTINE W REFLEX MICROSCOPIC
Glucose, UA: NEGATIVE
Ketones, ur: 40 — AB
pH: 5.5

## 2010-12-20 LAB — COMPREHENSIVE METABOLIC PANEL
ALT: 33
AST: 49 — ABNORMAL HIGH
Albumin: 3.1 — ABNORMAL LOW
Alkaline Phosphatase: 78
Chloride: 108
GFR calc Af Amer: >60
Potassium: 3.8
Sodium: 140
Total Bilirubin: 2.5 — ABNORMAL HIGH
Total Protein: 6.1

## 2010-12-20 LAB — URINE MICROSCOPIC-ADD ON

## 2010-12-27 LAB — CBC
HCT: 34.1 — ABNORMAL LOW
MCHC: 34.1
MCHC: 34.3
MCV: 89.9
MCV: 91.3
MCV: 91.7
Platelets: 197
Platelets: 215
Platelets: 220
RBC: 3.56 — ABNORMAL LOW
RBC: 3.69 — ABNORMAL LOW
RBC: 3.79 — ABNORMAL LOW
RDW: 13.2
RDW: 13.6
RDW: 13.7
WBC: 6.5

## 2010-12-27 LAB — PROTIME-INR
INR: 1.2
Prothrombin Time: 14.7
Prothrombin Time: 15.9 — ABNORMAL HIGH

## 2010-12-27 LAB — HEPARIN LEVEL (UNFRACTIONATED)
Heparin Unfractionated: 0.28 — ABNORMAL LOW
Heparin Unfractionated: 0.46
Heparin Unfractionated: 0.71 — ABNORMAL HIGH

## 2010-12-27 LAB — T3: T3, Total: 133.2

## 2010-12-28 LAB — I-STAT 8, (EC8 V) (CONVERTED LAB)
Acid-base deficit: 3 — ABNORMAL HIGH
Chloride: 109
Glucose, Bld: 171 — ABNORMAL HIGH
Potassium: 4
TCO2: 22
pCO2, Ven: 35.4 — ABNORMAL LOW
pH, Ven: 7.39 — ABNORMAL HIGH

## 2010-12-28 LAB — BASIC METABOLIC PANEL
BUN: 10
CO2: 23
CO2: 26
Calcium: 8.6
Calcium: 8.9
Chloride: 107
Creatinine, Ser: 0.72
Creatinine, Ser: 0.77
GFR calc Af Amer: >60
GFR calc Af Amer: >60
GFR calc non Af Amer: >60
GFR calc non Af Amer: >60
Glucose, Bld: 134 — ABNORMAL HIGH
Glucose, Bld: 134 — ABNORMAL HIGH
Glucose, Bld: 151 — ABNORMAL HIGH
Potassium: 3.9
Sodium: 141

## 2010-12-28 LAB — CBC
HCT: 34.7 — ABNORMAL LOW
HCT: 40.4
Hemoglobin: 11.9 — ABNORMAL LOW
Hemoglobin: 12.4
MCHC: 33.7
MCHC: 34.1
MCHC: 34.6
MCHC: 34.8
MCV: 90.6
MCV: 90.7
MCV: 90.7
MCV: 91.3
Platelets: 190
Platelets: 217
Platelets: 217
RBC: 3.95
RDW: 13.6
RDW: 13.8
RDW: 13.9
WBC: 7.1

## 2010-12-28 LAB — COMPREHENSIVE METABOLIC PANEL
BUN: 23
CO2: 22
Calcium: 9.2
Chloride: 103
Creatinine, Ser: 0.84
GFR calc non Af Amer: >60
Glucose, Bld: 166 — ABNORMAL HIGH
Total Bilirubin: 1.7 — ABNORMAL HIGH

## 2010-12-28 LAB — LIPID PANEL
HDL: 36 — ABNORMAL LOW
Total CHOL/HDL Ratio: 3.5
Triglycerides: 91

## 2010-12-28 LAB — POCT CARDIAC MARKERS
CKMB, poc: 5.5
Operator id: 161631
Troponin i, poc: 0.12 — ABNORMAL HIGH

## 2010-12-28 LAB — CK TOTAL AND CKMB (NOT AT ARMC)
Relative Index: INVALID
Total CK: 1079 — ABNORMAL HIGH
Total CK: 1123 — ABNORMAL HIGH

## 2010-12-28 LAB — TROPONIN I
Troponin I: 0.17 — ABNORMAL HIGH
Troponin I: 26.93
Troponin I: 33.34

## 2010-12-28 LAB — TSH: TSH: 0.012 — ABNORMAL LOW

## 2010-12-28 LAB — HEPARIN LEVEL (UNFRACTIONATED)
Heparin Unfractionated: 0.48
Heparin Unfractionated: 0.53

## 2010-12-28 LAB — POCT I-STAT CREATININE: Operator id: 161631

## 2010-12-28 LAB — DIFFERENTIAL
Basophils Absolute: 0
Lymphocytes Relative: 23
Lymphs Abs: 1.7
Neutrophils Relative %: 64

## 2010-12-30 ENCOUNTER — Other Ambulatory Visit: Payer: Self-pay | Admitting: Dermatology

## 2010-12-30 LAB — BASIC METABOLIC PANEL
BUN: 10
BUN: 15
BUN: 17
BUN: 9
CO2: 27
CO2: 28
CO2: 30
Calcium: 8 — ABNORMAL LOW
Calcium: 8.1 — ABNORMAL LOW
Calcium: 8.3 — ABNORMAL LOW
Calcium: 8.4
Calcium: 9.3
Chloride: 103
Chloride: 109
Creatinine, Ser: 0.68
Creatinine, Ser: 0.82
Creatinine, Ser: 1.19
GFR calc Af Amer: >60
GFR calc non Af Amer: >60
GFR calc non Af Amer: >60
GFR calc non Af Amer: >60
Glucose, Bld: 122 — ABNORMAL HIGH
Glucose, Bld: 145 — ABNORMAL HIGH
Potassium: 3.5
Potassium: 3.7
Potassium: 3.8
Sodium: 137
Sodium: 138
Sodium: 140

## 2010-12-30 LAB — CK TOTAL AND CKMB (NOT AT ARMC)
CK, MB: 5.6 — ABNORMAL HIGH
Relative Index: 3.3 — ABNORMAL HIGH
Relative Index: 4.1 — ABNORMAL HIGH
Relative Index: INVALID

## 2010-12-30 LAB — CROSSMATCH: Antibody Screen: NEGATIVE

## 2010-12-30 LAB — TYPE AND SCREEN: ABO/RH(D): A POS

## 2010-12-30 LAB — CBC
HCT: 26.2 — ABNORMAL LOW
HCT: 27.2 — ABNORMAL LOW
HCT: 29 — ABNORMAL LOW
HCT: 29.2 — ABNORMAL LOW
HCT: 35.5 — ABNORMAL LOW
Hemoglobin: 10.1 — ABNORMAL LOW
Hemoglobin: 10.1 — ABNORMAL LOW
Hemoglobin: 12
Hemoglobin: 14.3
Hemoglobin: 7.9 — CL
Hemoglobin: 9.8 — ABNORMAL LOW
MCHC: 34.5
MCHC: 34.5
MCHC: 34.5
MCHC: 34.5
MCHC: 34.6
MCHC: 35.1
MCV: 89.2
MCV: 90.3
MCV: 90.4
Platelets: 182
Platelets: 185
Platelets: 200
Platelets: 208
Platelets: 242
Platelets: 258
RBC: 2.5 — ABNORMAL LOW
RBC: 3.18 — ABNORMAL LOW
RBC: 3.24 — ABNORMAL LOW
RBC: 4.36
RBC: 4.64
RDW: 13.3
RDW: 13.4
RDW: 13.7
RDW: 13.9
WBC: 6.1
WBC: 7
WBC: 7.7
WBC: 7.9
WBC: 8.3
WBC: 9.7

## 2010-12-30 LAB — URINALYSIS, ROUTINE W REFLEX MICROSCOPIC
Glucose, UA: NEGATIVE
Ketones, ur: NEGATIVE
Leukocytes, UA: NEGATIVE
Nitrite: NEGATIVE
Protein, ur: NEGATIVE
Urobilinogen, UA: 0.2

## 2010-12-30 LAB — HEMOGLOBIN AND HEMATOCRIT, BLOOD
HCT: 28.3 — ABNORMAL LOW
HCT: 29.2 — ABNORMAL LOW
HCT: 29.7 — ABNORMAL LOW
HCT: 31.9 — ABNORMAL LOW
Hemoglobin: 10 — ABNORMAL LOW
Hemoglobin: 10.6 — ABNORMAL LOW
Hemoglobin: 11.9 — ABNORMAL LOW
Hemoglobin: 9.7 — ABNORMAL LOW

## 2010-12-30 LAB — URINALYSIS, MICROSCOPIC ONLY
Bilirubin Urine: NEGATIVE
Glucose, UA: NEGATIVE
Specific Gravity, Urine: 1.093 — ABNORMAL HIGH (ref 1.005–1.035)
pH: 5.5

## 2010-12-30 LAB — COMPREHENSIVE METABOLIC PANEL
ALT: 18
AST: 23
AST: 23
Albumin: 2.3 — ABNORMAL LOW
Albumin: 2.5 — ABNORMAL LOW
Alkaline Phosphatase: 52
Alkaline Phosphatase: 69
BUN: 10
BUN: 8
CO2: 26
CO2: 28
CO2: 28
Calcium: 8.1 — ABNORMAL LOW
Calcium: 9.2
Chloride: 105
Chloride: 106
Creatinine, Ser: 0.86
Creatinine, Ser: 0.9
GFR calc Af Amer: >60
GFR calc Af Amer: >60
GFR calc non Af Amer: >60
GFR calc non Af Amer: >60
Glucose, Bld: 144 — ABNORMAL HIGH
Potassium: 3.5
Potassium: 3.6
Sodium: 139
Total Bilirubin: 1.8 — ABNORMAL HIGH
Total Bilirubin: 2 — ABNORMAL HIGH
Total Bilirubin: 2.4 — ABNORMAL HIGH
Total Protein: 4.9 — ABNORMAL LOW

## 2010-12-30 LAB — CARDIAC PANEL(CRET KIN+CKTOT+MB+TROPI)
CK, MB: 5.2 — ABNORMAL HIGH
CK, MB: 5.5 — ABNORMAL HIGH
Relative Index: 2.1
Relative Index: 2.1
Relative Index: 2.6 — ABNORMAL HIGH
Total CK: 210 — ABNORMAL HIGH
Total CK: 49
Total CK: 64
Troponin I: 0.24 — ABNORMAL HIGH
Troponin I: 0.45 — ABNORMAL HIGH

## 2010-12-30 LAB — PROTIME-INR
INR: 1
INR: 1.1
Prothrombin Time: 13.7
Prothrombin Time: 14.4

## 2010-12-30 LAB — POCT CARDIAC MARKERS
CKMB, poc: 1.1
Myoglobin, poc: 55.1
Myoglobin, poc: 72
Operator id: 198171
Troponin i, poc: <0.05

## 2010-12-30 LAB — TROPONIN I: Troponin I: 0.49 — ABNORMAL HIGH

## 2010-12-30 LAB — HEPARIN LEVEL (UNFRACTIONATED)
Heparin Unfractionated: 0.42
Heparin Unfractionated: 0.43

## 2010-12-30 LAB — LIPID PANEL
HDL: 34 — ABNORMAL LOW
LDL Cholesterol: 168 — ABNORMAL HIGH
Total CHOL/HDL Ratio: 7.5
Triglycerides: 260 — ABNORMAL HIGH
VLDL: 52 — ABNORMAL HIGH

## 2010-12-30 LAB — URINE CULTURE
Colony Count: NO GROWTH
Culture: NO GROWTH

## 2010-12-30 LAB — DIFFERENTIAL
Eosinophils Relative: 1
Lymphocytes Relative: 25
Lymphs Abs: 1.4

## 2010-12-30 LAB — HEMOGLOBIN A1C: Hgb A1c MFr Bld: 7.3 — ABNORMAL HIGH

## 2010-12-30 LAB — APTT: aPTT: 28

## 2010-12-30 LAB — ABO/RH: ABO/RH(D): A POS

## 2013-03-22 ENCOUNTER — Other Ambulatory Visit: Payer: Self-pay | Admitting: Dermatology

## 2013-09-06 ENCOUNTER — Other Ambulatory Visit: Payer: Self-pay | Admitting: Internal Medicine

## 2013-09-06 ENCOUNTER — Ambulatory Visit
Admission: RE | Admit: 2013-09-06 | Discharge: 2013-09-06 | Disposition: A | Discharge status: Home / Self Care | Payer: Medicare Other | Source: Ambulatory Visit | Attending: Internal Medicine | Admitting: Internal Medicine

## 2013-09-06 DIAGNOSIS — M25552 Pain in left hip: Secondary | ICD-10-CM

## 2014-11-14 ENCOUNTER — Encounter: Payer: Self-pay | Admitting: Cardiovascular Disease

## 2015-04-01 ENCOUNTER — Other Ambulatory Visit: Payer: Self-pay | Admitting: Geriatric Medicine

## 2015-04-01 ENCOUNTER — Ambulatory Visit
Admission: RE | Admit: 2015-04-01 | Discharge: 2015-04-01 | Disposition: A | Discharge status: Home / Self Care | Payer: PRIVATE HEALTH INSURANCE | Source: Ambulatory Visit | Attending: Geriatric Medicine | Admitting: Geriatric Medicine

## 2015-04-01 DIAGNOSIS — W19XXXA Unspecified fall, initial encounter: Secondary | ICD-10-CM

## 2016-08-01 ENCOUNTER — Encounter (HOSPITAL_COMMUNITY): Payer: Self-pay | Admitting: Emergency Medicine

## 2016-08-01 ENCOUNTER — Emergency Department (HOSPITAL_COMMUNITY)
Admission: EM | Admit: 2016-08-01 | Discharge: 2016-08-01 | Disposition: A | Discharge status: Home / Self Care | Payer: Medicare Other | Attending: Emergency Medicine | Admitting: Emergency Medicine

## 2016-08-01 ENCOUNTER — Emergency Department (HOSPITAL_COMMUNITY): Payer: Medicare Other

## 2016-08-01 DIAGNOSIS — I10 Essential (primary) hypertension: Secondary | ICD-10-CM | POA: Insufficient documentation

## 2016-08-01 DIAGNOSIS — Z7982 Long term (current) use of aspirin: Secondary | ICD-10-CM | POA: Diagnosis not present

## 2016-08-01 DIAGNOSIS — I639 Cerebral infarction, unspecified: Secondary | ICD-10-CM | POA: Insufficient documentation

## 2016-08-01 DIAGNOSIS — I998 Other disorder of circulatory system: Secondary | ICD-10-CM

## 2016-08-01 DIAGNOSIS — E039 Hypothyroidism, unspecified: Secondary | ICD-10-CM | POA: Diagnosis not present

## 2016-08-01 DIAGNOSIS — E119 Type 2 diabetes mellitus without complications: Secondary | ICD-10-CM | POA: Insufficient documentation

## 2016-08-01 DIAGNOSIS — R4182 Altered mental status, unspecified: Secondary | ICD-10-CM | POA: Diagnosis present

## 2016-08-01 LAB — COMPREHENSIVE METABOLIC PANEL
ALBUMIN: 3.2 g/dL — AB (ref 3.5–5.0)
ALK PHOS: 92 U/L (ref 38–126)
ALT: 45 U/L (ref 14–54)
ANION GAP: 10 (ref 5–15)
AST: 128 U/L — ABNORMAL HIGH (ref 15–41)
BUN: 11 mg/dL (ref 6–20)
CALCIUM: 8.9 mg/dL (ref 8.9–10.3)
CO2: 28 mmol/L (ref 22–32)
Chloride: 99 mmol/L — ABNORMAL LOW (ref 101–111)
Creatinine, Ser: 0.77 mg/dL (ref 0.44–1.00)
GFR calc Af Amer: >60 mL/min (ref >60)
GFR calc non Af Amer: >60 mL/min (ref >60)
GLUCOSE: 144 mg/dL — AB (ref 65–99)
POTASSIUM: 3.6 mmol/L (ref 3.5–5.1)
SODIUM: 137 mmol/L (ref 135–145)
TOTAL PROTEIN: 7 g/dL (ref 6.5–8.1)
Total Bilirubin: 2.1 mg/dL — ABNORMAL HIGH (ref 0.3–1.2)

## 2016-08-01 LAB — I-STAT TROPONIN, ED: Troponin i, poc: 0.12 ng/mL (ref 0.00–0.08)

## 2016-08-01 LAB — CBC WITH DIFFERENTIAL/PLATELET
BASOS PCT: 0 %
Basophils Absolute: 0 10*3/uL (ref 0.0–0.1)
EOS ABS: 0 10*3/uL (ref 0.0–0.7)
EOS PCT: 0 %
HCT: 46.4 % — ABNORMAL HIGH (ref 36.0–46.0)
HEMOGLOBIN: 15.7 g/dL — AB (ref 12.0–15.0)
LYMPHS ABS: 2.6 10*3/uL (ref 0.7–4.0)
Lymphocytes Relative: 19 %
MCH: 30 pg (ref 26.0–34.0)
MCHC: 33.8 g/dL (ref 30.0–36.0)
MCV: 88.7 fL (ref 78.0–100.0)
MONO ABS: 1.3 10*3/uL — AB (ref 0.1–1.0)
MONOS PCT: 10 %
Neutro Abs: 9.8 10*3/uL — ABNORMAL HIGH (ref 1.7–7.7)
Neutrophils Relative %: 71 %
Platelets: 201 10*3/uL (ref 150–400)
RBC: 5.23 MIL/uL — ABNORMAL HIGH (ref 3.87–5.11)
RDW: 13.8 % (ref 11.5–15.5)
WBC: 13.8 10*3/uL — ABNORMAL HIGH (ref 4.0–10.5)

## 2016-08-01 LAB — SAMPLE TO BLOOD BANK

## 2016-08-01 LAB — I-STAT CG4 LACTIC ACID, ED: Lactic Acid, Venous: 1.64 mmol/L (ref 0.5–1.9)

## 2016-08-01 MED ORDER — MORPHINE SULFATE (PF) 4 MG/ML IV SOLN
4.0000 mg | Freq: Once | INTRAVENOUS | Status: AC
  Administered 2016-08-01: 4 mg via INTRAVENOUS
  Filled 2016-08-01: qty 1

## 2016-08-01 MED ORDER — MORPHINE SULFATE (PF) 4 MG/ML IV SOLN
2.0000 mg | INTRAVENOUS | Status: DC | PRN
  Administered 2016-08-01: 2 mg via INTRAVENOUS
  Filled 2016-08-01: qty 1

## 2016-08-01 MED ORDER — LORAZEPAM 2 MG/ML IJ SOLN: 1.0000 mg | INTRAMUSCULAR | Status: DC | PRN

## 2016-08-01 NOTE — Clinical Social Work Note (Signed)
CSW consulted. [facility]. At this time [facility] has already been called. CS[facility] and they are prepared for pt at this time. Clinical Social Worker will sign off for now as social work intervention is no longer needed. Please consult us again if new need arises.  KlondikeBridget Natha Guin, [place]WA [phone]

## 2016-08-01 NOTE — ED Notes (Signed)
Pt's son at bedside speaking with Allyne GeeSanders, [place].

## 2016-08-01 NOTE — Discharge Instructions (Signed)
You will be going to [facility] to begin hospice care today.  [address] [place], [place] [address] [phone]

## 2016-08-01 NOTE — ED Triage Notes (Signed)
Pt arrives from the [facility] for a cold right foot since yesterday 1500. Per ems pt has history of Alzheimer's but since yesterday pt has been less alert and not speaking as much as usual. Pt is alert to verbal.

## 2016-08-01 NOTE — ED Notes (Signed)
Left a voicemail for Palliative Medicine regarding pt.

## 2016-08-01 NOTE — ED Provider Notes (Signed)
[place] DEPT Provider Note   CSN: [id number] Arrival date & time: 08/01/16  6004     History   Chief Complaint Chief Complaint  Patient presents with  . Altered Mental Status  . Cold Extremity    HPI [name] is a 83 y.o. female.  The history is provided by the patient and medical records.    Level V caveat: Dementia  83 y.o. F with hx of AFIB, hiatal hernia, hypertension, hypercholesterolemia, hypothyroidism, diabetes, history of Alzheimer's dementia, presenting to the ED with altered mental status.  Patient coming in from [facility] home.  Per staff at facility she has been less responsive over the past 24-48 hours.  They also noticed this morning that her right foot and lower leg were cold.  Patient arouses to verbal and tactile stimuli, follows some commands but limited movement and no verbal responses.  Past Medical History:  Diagnosis Date  . A-fib ([place])   . Duodenal ulcer due to nonsteroidal anti-inflammatory drug (NSAID)   . Esophageal stricture   . Hiatal hernia   . HTN (hypertension)   . Hypercholesteremia   . Hypothyroidism   . Internal hemorrhoid   . Type 2 diabetes mellitus (HCC)     There are no active problems to display for this patient.   History reviewed. No pertinent surgical history.  OB History    No data available       Home Medications    Prior to Admission medications   Medication Sig Start Date End Date Taking? Authorizing Provider  aspirin 81 MG tablet Take 81 mg by mouth daily.    [provider]  diltiazem (DILACOR XR) 240 MG 24 hr capsule Take 240 mg by mouth daily.    [provider]  levothyroxine (SYNTHROID, LEVOTHROID) 100 MCG tablet Take 100 mcg by mouth daily before breakfast.    [provider]    Family History No family history on file.  Social History Social History  Substance Use Topics  . Smoking status: Not on file  . Smokeless tobacco: Not on file  . Alcohol use  Not on file     Allergies   Metformin and related   Review of Systems Review of Systems  Unable to perform ROS: Dementia     Physical Exam Updated Vital Signs BP 127/76 (BP Location: Right Arm)   Pulse 87   Resp 16   SpO2 98%   Physical Exam  Constitutional: She appears well-developed and well-nourished.  Elderly  HENT:  Head: Normocephalic and atraumatic.  Mouth/Throat: Oropharynx is clear and moist.  Eyes: Conjunctivae and EOM are normal. Pupils are equal, round, and reactive to light.  Neck: Normal range of motion.  Cardiovascular: Normal rate, regular rhythm and normal heart sounds.   Pulmonary/Chest: Effort normal and breath sounds normal.  Abdominal: Soft. Bowel sounds are normal.  Musculoskeletal: Normal range of motion.  Right foot appears dusky and is cold to touch up to about mid-shin; there is no palpable or dopplerable pulse of right lower extremity Left extremity with palpable DP and PT pulse  Neurological:  arouseable to verbal and tactile stimuli, follows some commands such as opening eyes but not significant movement of the extremities, no verbal responses given  Skin: Skin is warm and dry.  Psychiatric: She has a normal mood and affect.  Nursing note and vitals reviewed.    ED Treatments / Results  Labs (all labs ordered are listed, but only abnormal results are displayed) Labs  Reviewed  CBC WITH DIFFERENTIAL/PLATELET - Abnormal; Notable for the following:       Result Value   WBC 13.8 (*)    RBC 5.23 (*)    Hemoglobin 15.7 (*)    HCT 46.4 (*)    Neutro Abs 9.8 (*)    Monocytes Absolute 1.3 (*)    All other components within normal limits  COMPREHENSIVE METABOLIC PANEL - Abnormal; Notable for the following:    Chloride 99 (*)    Glucose, Bld 144 (*)    Albumin 3.2 (*)    AST 128 (*)    Total Bilirubin 2.1 (*)    All other components within normal limits  I-STAT TROPOININ, ED - Abnormal; Notable for the following:    Troponin i, poc 0.12  (*)    All other components within normal limits  URINE CULTURE  CULTURE, BLOOD (ROUTINE X 2)  CULTURE, BLOOD (ROUTINE X 2)  AMMONIA  URINALYSIS, ROUTINE W REFLEX MICROSCOPIC  I-STAT CG4 LACTIC ACID, ED  SAMPLE TO BLOOD BANK    EKG  EKG Interpretation  Date/Time:  Monday Aug 01 2016 07:43:29 EDT Ventricular Rate:  87 PR Interval:    QRS Duration: 100 QT Interval:  412 QTC Calculation: 496 R Axis:   105 Text Interpretation:  Right and left arm electrode reversal, interpretation assumes no reversal Atrial fibrillation Anteroseptal infarct, age indeterminate No significant change since last tracing Confirmed by Blanchie Dessert [phone]) on 08/01/2016 8:02:00 AM       Radiology Dg Chest 2 View  Result Date: 08/01/2016 CLINICAL DATA:  Altered mental status, atrial fibrillation EXAM: CHEST  2 VIEW COMPARISON:  09/23/2006 chest radiograph. FINDINGS: Stable cardiomediastinal silhouette with normal heart size and aortic atherosclerosis. No pneumothorax. No pleural effusion. Patchy reticular opacities predominantly at the lung bases appear chronic and not convincingly changed. No pulmonary edema. No acute consolidative airspace disease. IMPRESSION: 1. No acute cardiopulmonary disease. 2. Chronic basilar predominant patchy reticular lung opacities, not convincingly changed, which suggests a chronic nonprogressive interstitial lung disease. 3. Aortic atherosclerosis. Electronically Signed   By: Ilona Sorrel M.D.   On: 08/01/2016 08:29   Ct Head Wo Contrast  Result Date: 08/01/2016 CLINICAL DATA:  83 year old female with Alzheimer's with change in mental status and speech difficulty since yesterday. Initial encounter. EXAM: CT HEAD WITHOUT CONTRAST TECHNIQUE: Contiguous axial images were obtained from the base of the skull through the vertex without intravenous contrast. COMPARISON:  04/01/2015 head CT. FINDINGS: Brain: New from the prior examination is infarct involving right temporal  -parietal-occipital region probably acute without associated hemorrhage. Probable artifact extending through the medulla. Remote small cerebellar infarcts. Remote bilateral frontal lobe infarcts larger on the right. Prominent chronic microvascular changes. Global atrophy without hydrocephalus. No intracranial mass lesion noted on this unenhanced exam. Vascular: Vascular calcifications. Skull: No acute abnormality. Sinuses/Orbits: Post lens replacement without acute orbital abnormality. Visualized sinuses are clear Other: Mastoid air cells and middle ear cavities are clear. IMPRESSION: Acute nonhemorrhagic infarct right temporal -parietal -occipital lobe. Probable artifact extending through the medulla. Remote bilateral frontal lobe infarcts and cerebellar infarcts. Chronic microvascular changes. Atrophy. Electronically Signed   By: Genia Del M.D.   On: 08/01/2016 08:47    Procedures Procedures (including critical care time)  Medications Ordered in ED Medications - No data to display   Initial Impression / Assessment and Plan / ED Course  I have reviewed the triage vital signs and the nursing notes.  Pertinent labs & imaging results that were available  during my care of the patient were reviewed by me and considered in my medical decision making (see chart for details).  83 year old female presenting to the ED with altered mental status. She does have history of dementia but staff at her nursing facility reports a decline in the past 24-48 hours. She also has a cold and pulseless right foot which is new. Patient does have history of atrial fibrillation, currently only on aspirin.  She is not in any acute distress at this time. She does not voice any complaints during exam.  Hemodynamically stable.  Will plan for work-up including CT head to assess for stroke, labs, UA.  Patient has active DNR in place.    8:10 AM I have spoken with patient's son, Shanyn Preisler, who is her POA.  Reports current  presentation is her baseline mental status.   I discussed my exam findings thus far including a pulseless right foot as well as cold and dusky right lower leg. I discussed intervention measures such as heparin, surgery, etc. Risks and benefits of these interventions were discussed, patient's son would prefer palliative care approach.  He reports that patient expressed over year ago that she was ready to die and even asked him to bring her a gun so she could commit suicide. He states she does not feel that patient would want any extraordinary measures or surgical intervention at this time. Will consult with palliative care.  9:26 AM Palliative care has met with patient and spoken with her son.  CT head does reveal an acute right temporal/parietal/occipital lobe infarct. Son who is POA remains adamant about palliative care approach.  Hospice care has been arranged at [facility] and patient will be discharged under their care.  This was explained to son in great detail and he is fully on board with this plan.  Patient did seem to get somewhat agitated and moving her right foot-- question if this is due to pain/discomfort.  Dose of morphine was given prior to discharge.  Patient seen and evaluated with attending physician, Dr. Maryan Rued, who agrees with assessment and plan of care.  Final Clinical Impressions(s) / ED Diagnoses   Final diagnoses:  Ischemia of foot  Cerebrovascular accident (CVA), unspecified mechanism [facility])    New Prescriptions Discharge Medication List as of 08/01/2016 10:31 AM       Larene Pickett, PA-C 08/01/16 1114    Blanchie Dessert, MD 08/01/16 1232

## 2016-08-01 NOTE — ED Notes (Signed)
Pt removed from most monitoring equipment and second IV removed [facility].

## 2016-08-01 NOTE — Progress Notes (Signed)
Palliative Medicine RN Note: Consult rec'd. Pt has newly discovered ischemic leg and new CVA. Son has already told ED PA that pt has verbalized desire to die. In light of these new dx, he is electing full comfort care.   Discussed with son Rebecca KoyanagiBrooks [name] [phone]). He [facility] of [facility] hospice bc [facility] is [facility] home. She has been on hospice before; son states she was d/c last month from hospice but does not remember which company. I attempted to call [facility] different [facility] to update them, but got no answers and had to leave VM. Updated Rebecca BarriosEva w [facility] (they cover the pt there) to watch for referral from SNF when she returns.   Comfort orders initiated per conversation with Dr Phillips OdorGolding. Rebecca StanleyLisa in ED is writing d/c orders now. Please contact [facility] if we can be of any further assistance.   Rebecca ChanceMelanie G. Aniqa Hare, RN, BSN, [facility] 08/01/2016 9:32 AM Cell [phone] 8:00-4:00 Monday-Friday Office [phone]

## 2016-08-06 LAB — CULTURE, BLOOD (ROUTINE X 2)
CULTURE: NO GROWTH
SPECIAL REQUESTS: ADEQUATE

## 2016-09-11 DEATH — deceased

## 2018-10-03 IMAGING — CT CT HEAD W/O CM
3 of 4 series · 13 of 47 positions shown, 15 images · non-contrast
Comparison: 04/01/2015 head CT.

CLINICAL DATA: [AGE] female with Alzheimer's with change in
mental status and speech difficulty since yesterday. Initial
encounter.

EXAM:
CT HEAD WITHOUT CONTRAST
TECHNIQUE: Contiguous axial images were obtained from the base of the skull
through the vertex without intravenous contrast.

[Series 3: head without · axial · non-contrast · 0.42mm/px · z∈[-55,+65]mm · 7 of 33 slices shown, 9 images]
[im 5/33  brain]
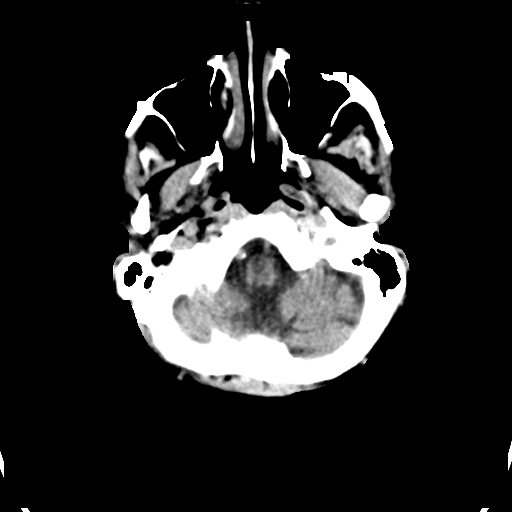
[im 5/33  bone]
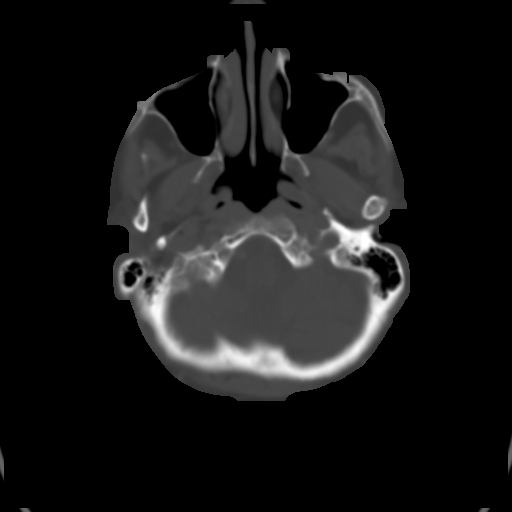
[im 9/33  brain]
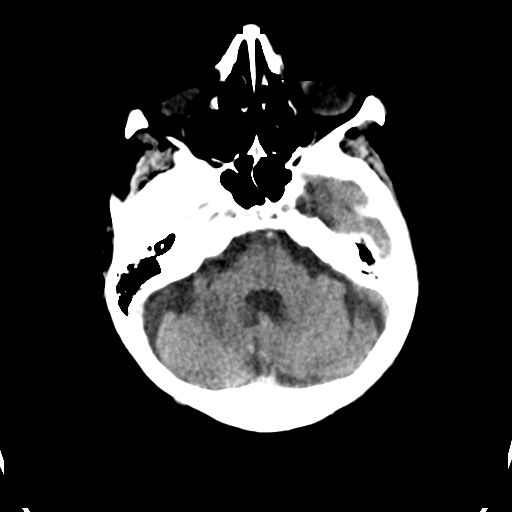
[im 13/33  brain]
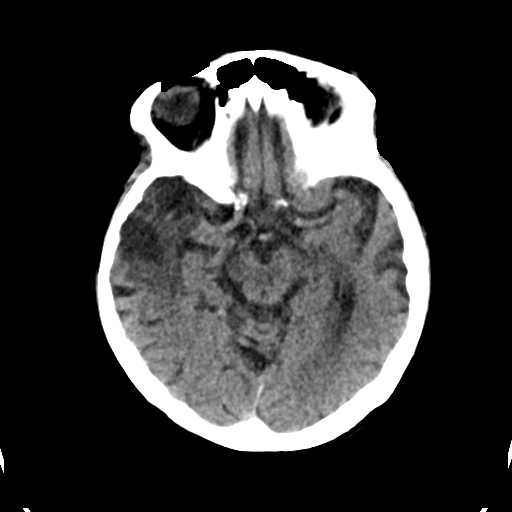
[im 17/33  brain]
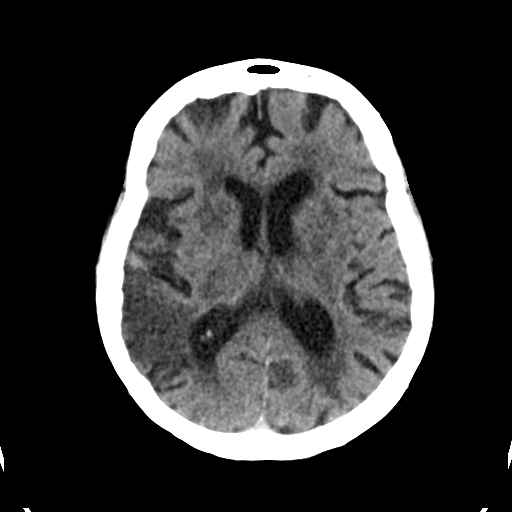
[im 21/33  brain]
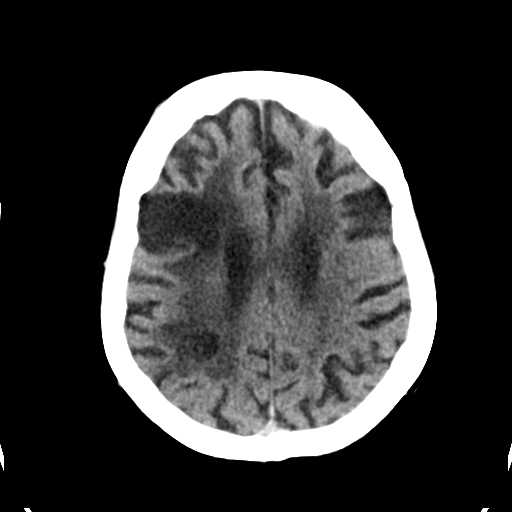
[im 21/33  bone]
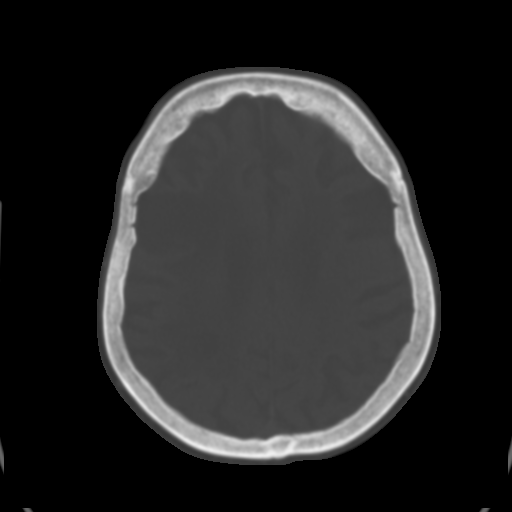
[im 25/33  brain]
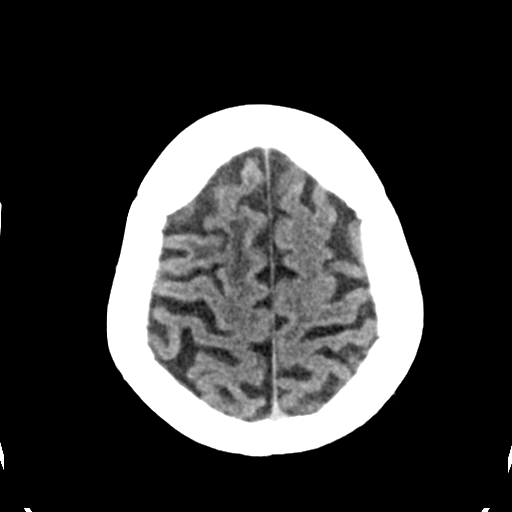
[im 29/33  brain]
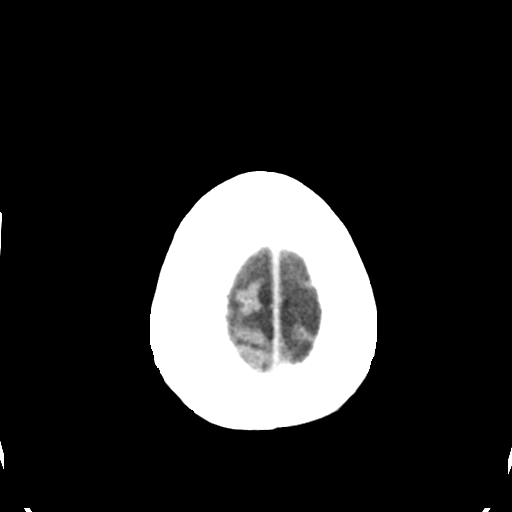

[Series 5: head without cor · coronal · non-contrast · 0.32mm/px · 3 of 72 slices shown]
[im 24/72  brain]
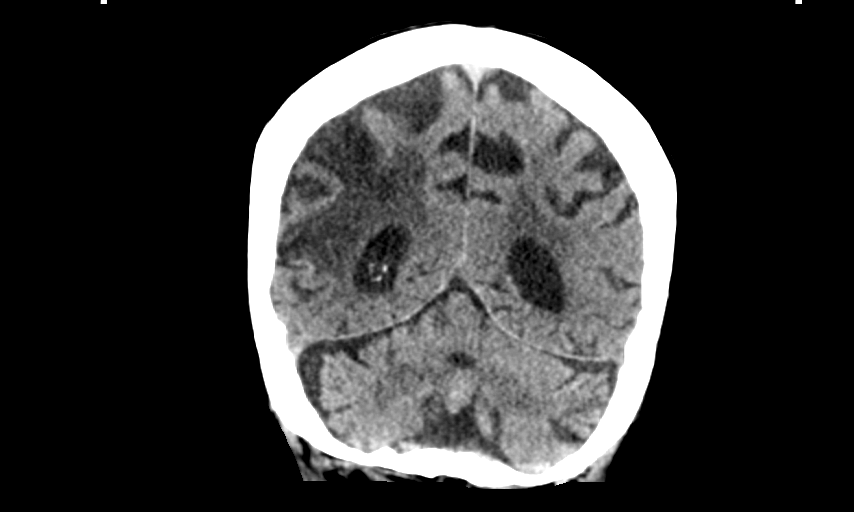
[im 32/72  brain]
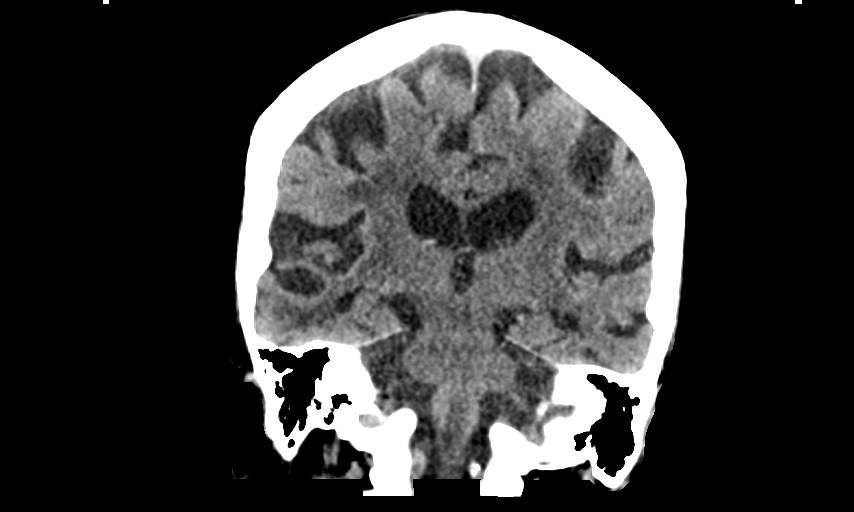
[im 40/72  brain]
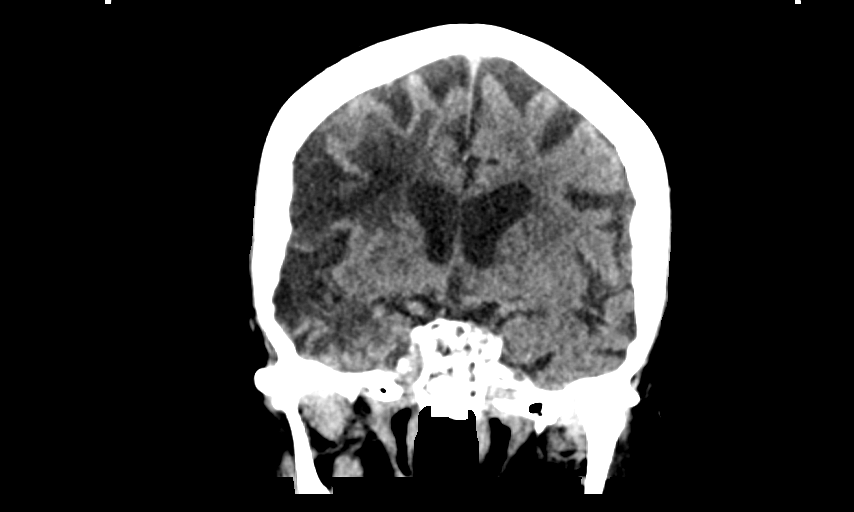

[Series 6: head without sag · sagittal · non-contrast · 0.36mm/px · 3 of 66 slices shown]
[im 22/66  brain]
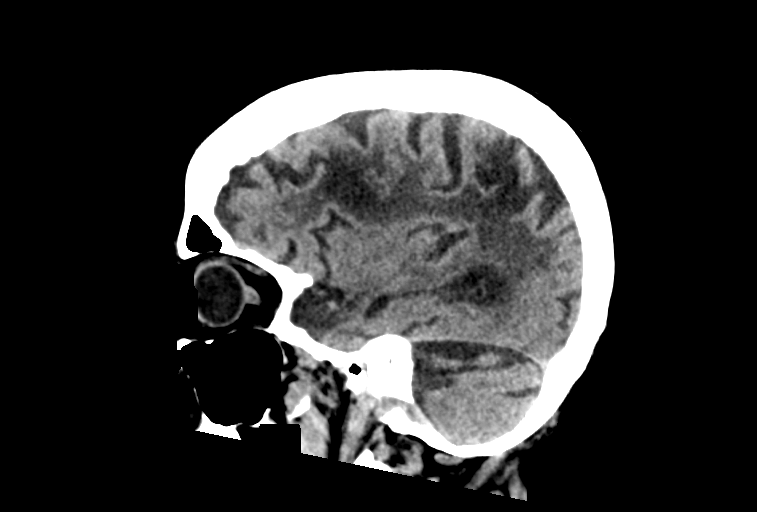
[im 33/66  brain]
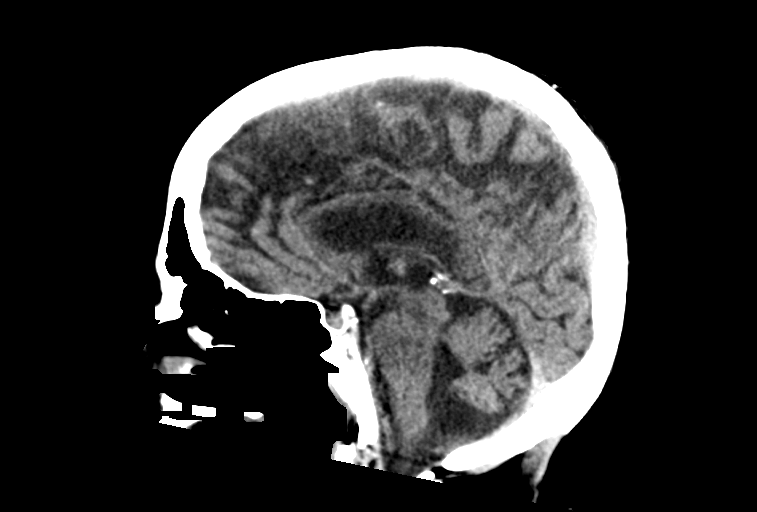
[im 44/66  brain]
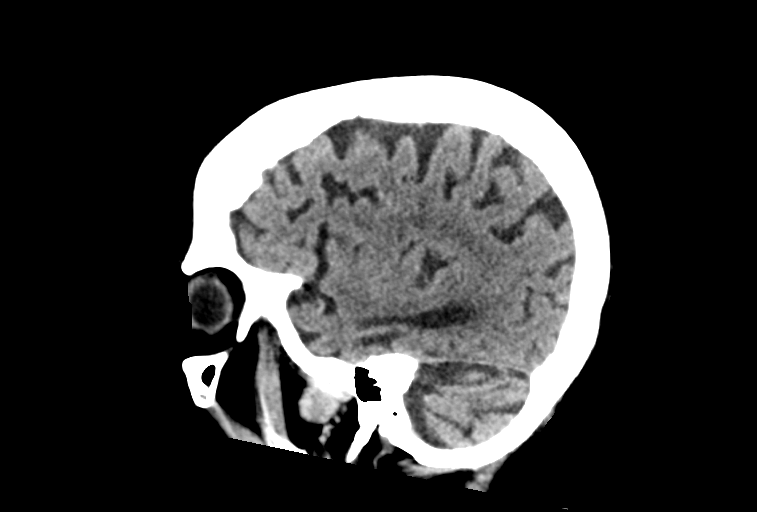

[13 of 47 positions shown; findings below may reference images not displayed]

FINDINGS: Brain: New from the prior examination is infarct involving right
temporal -parietal-occipital region probably acute without
associated hemorrhage.

Probable artifact extending through the medulla. Remote small
cerebellar infarcts.

Remote bilateral frontal lobe infarcts larger on the right.

Prominent chronic microvascular changes.

Global atrophy without hydrocephalus.

No intracranial mass lesion noted on this unenhanced exam.

Vascular: Vascular calcifications.

Skull: No acute abnormality.

Sinuses/Orbits: Post lens replacement without acute orbital
abnormality. Visualized sinuses are clear

Other: Mastoid air cells and middle ear cavities are clear.
IMPRESSION: Acute nonhemorrhagic infarct right temporal -parietal -occipital
lobe.

Probable artifact extending through the medulla.

Remote bilateral frontal lobe infarcts and cerebellar infarcts.

Chronic microvascular changes.

Atrophy.
